# Patient Record
Sex: Female | Born: 1985
Health system: Southern US, Community
[De-identification: ages and names within clinical notes are randomized; demographics above are authoritative.]

## PROBLEM LIST (undated history)

## (undated) DIAGNOSIS — O24419 Gestational diabetes mellitus in pregnancy, unspecified control: Secondary | ICD-10-CM

## (undated) DIAGNOSIS — F32A Depression, unspecified: Secondary | ICD-10-CM

## (undated) DIAGNOSIS — IMO0001 Reserved for inherently not codable concepts without codable children: Secondary | ICD-10-CM

## (undated) DIAGNOSIS — F329 Major depressive disorder, single episode, unspecified: Secondary | ICD-10-CM

## (undated) DIAGNOSIS — R030 Elevated blood-pressure reading, without diagnosis of hypertension: Secondary | ICD-10-CM

## (undated) DIAGNOSIS — I10 Essential (primary) hypertension: Secondary | ICD-10-CM

## (undated) HISTORY — DX: Depression, unspecified: F32.A

## (undated) HISTORY — DX: Elevated blood-pressure reading, without diagnosis of hypertension: R03.0

## (undated) HISTORY — DX: Essential (primary) hypertension: I10

## (undated) HISTORY — DX: Gestational diabetes mellitus in pregnancy, unspecified control: O24.419

## (undated) HISTORY — DX: Major depressive disorder, single episode, unspecified: F32.9

## (undated) HISTORY — PX: NO PAST SURGERIES: SHX2092

## (undated) HISTORY — DX: Reserved for inherently not codable concepts without codable children: IMO0001

---

## 2016-05-16 ENCOUNTER — Encounter: Payer: Self-pay | Admitting: Primary Care

## 2016-05-16 ENCOUNTER — Ambulatory Visit (INDEPENDENT_AMBULATORY_CARE_PROVIDER_SITE_OTHER): Payer: BLUE CROSS/BLUE SHIELD | Admitting: Primary Care

## 2016-05-16 VITALS — BP 142/90 | HR 82 | Temp 98.3°F | Ht 62.0 in | Wt 186.4 lb

## 2016-05-16 DIAGNOSIS — R030 Elevated blood-pressure reading, without diagnosis of hypertension: Secondary | ICD-10-CM | POA: Diagnosis not present

## 2016-05-16 DIAGNOSIS — I10 Essential (primary) hypertension: Secondary | ICD-10-CM | POA: Insufficient documentation

## 2016-05-16 DIAGNOSIS — IMO0001 Reserved for inherently not codable concepts without codable children: Secondary | ICD-10-CM

## 2016-05-16 DIAGNOSIS — M25569 Pain in unspecified knee: Secondary | ICD-10-CM

## 2016-05-16 DIAGNOSIS — M25561 Pain in right knee: Secondary | ICD-10-CM | POA: Diagnosis not present

## 2016-05-16 HISTORY — DX: Pain in unspecified knee: M25.569

## 2016-05-16 HISTORY — DX: Elevated blood-pressure reading, without diagnosis of hypertension: R03.0

## 2016-05-16 MED ORDER — PREDNISONE 10 MG PO TABS: ORAL_TABLET | ORAL | Status: DC

## 2016-05-16 NOTE — Patient Instructions (Signed)
Start prednisone for inflammation and pain to your knee. Take 3 tablets for 2 days, then 2 tablets for 2 days, then 1 tablet for 2 days.  Apply ice to your knee at least twice daily for 20 minutes at a time.  Continue tylenol as needed for pain.  You will be contacted regarding your referral to physical therapy.  Please let us know if you have not heard back within one week.   Please schedule a physical with me before the end of the year. You may also schedule a lab only appointment 3-4 days prior. We will discuss your lab results in detail during your physical.  It was a pleasure to meet you today! Please don't hesitate to call me with any questions. Welcome to [facility]!  Knee Pain Knee pain is a very common symptom and can have many causes. Knee pain often goes away when you follow your health care provider's instructions for relieving pain and discomfort at home. However, knee pain can develop into a condition that needs treatment. Some conditions may include:  Arthritis caused by wear and tear (osteoarthritis).  Arthritis caused by swelling and irritation (rheumatoid arthritis or gout).  A cyst or growth in your knee.  An infection in your knee joint.  An injury that will not heal.  Damage, swelling, or irritation of the tissues that support your knee (torn ligaments or tendinitis). If your knee pain continues, additional tests may be ordered to diagnose your condition. Tests may include X-rays or other imaging studies of your knee. You may also need to have fluid removed from your knee. Treatment for ongoing knee pain depends on the cause, but treatment may include:  Medicines to relieve pain or swelling.  Steroid injections in your knee.  Physical therapy.  Surgery. HOME CARE INSTRUCTIONS  Take medicines only as directed by your health care provider.  Rest your knee and keep it raised (elevated) while you are resting.  Do not do things that cause or worsen  pain.  Avoid high-impact activities or exercises, such as running, jumping rope, or doing jumping jacks.  Apply ice to the knee area:  Put ice in a plastic bag.  Place a towel between your skin and the bag.  Leave the ice on for 20 minutes, 2-3 times a day.  Ask your health care provider if you should wear an elastic knee support.  Keep a pillow under your knee when you sleep.  Lose weight if you are overweight. Extra weight can put pressure on your knee.  Do not use any tobacco products, including cigarettes, chewing tobacco, or electronic cigarettes. If you need help quitting, ask your health care provider. Smoking may slow the healing of any bone and joint problems that you may have. SEEK MEDICAL CARE IF:  Your knee pain continues, changes, or gets worse.  You have a fever along with knee pain.  Your knee buckles or locks up.  Your knee becomes more swollen. SEEK IMMEDIATE MEDICAL CARE IF:   Your knee joint feels hot to the touch.  You have chest pain or trouble breathing.   This information is not intended to replace advice given to you by your health care provider. Make sure you discuss any questions you have with your health care provider.   Document Released: 08/10/2007 Document Revised: 11/03/2014 Document Reviewed: 05/29/2014 Elsevier Interactive Patient Education [employer].

## 2016-05-16 NOTE — Assessment & Plan Note (Signed)
Slightly above goal in clinic today. Endorses history of prior elevations during pregnancies and also intermittently since. Elevation today could be due to acute knee pain. We'll have her monitor and report readings at or above 140/90.

## 2016-05-16 NOTE — Assessment & Plan Note (Signed)
Chronic for 1 year, worse over the last 2 months with swelling and increased discomfort. Exam with decreased range of motion due to discomfort and stiffness. Will send to physical therapy for further evaluation and treatment. Prescription for prednisone taper sent to pharmacy for acute inflammation as she is allergic to NSAIDs.

## 2016-05-16 NOTE — Progress Notes (Signed)
Pre visit review using our clinic review tool, if applicable. No additional management support is needed unless otherwise documented below in the visit note. 

## 2016-05-16 NOTE — Progress Notes (Signed)
   Subjective:    Patient ID: Brandy Mack, female    DOB: 11/13/1985, 30 y.o.   MRN: [id number]  HPI  Brandy Mack is a 30 year old female who presents today to establish care and discuss the problems mentioned below. Will obtain old records.  1) Knee Pain: Located to the right knee that has been present intermittently over the last 1 year. Over the past 2 months she's noticed popping, decreased flexion, and pain that will start 1 hour after getting out of bed and will progress throughout the day. She is on her feet all day during work hours. She feels as though it's unstable. She does not play sports, denies recent injury, denies family history of arthritis, numbness/tingling. She does experience pain to the right hip and wrist. She's taken tylenol with temporary improvement.   2) Elevated Blood Pressure Reading: Her blood pressure is above goal in the clinic today at 142/90. She endorses a history of elevated readings during her prior pregnancies. Family history of hypertension in her father. Denies chest pain, dizziness, changes in her vision.   Review of Systems  Constitutional: Negative for fever.  Respiratory: Negative for shortness of breath.   Cardiovascular: Negative for chest pain.  Musculoskeletal: Positive for arthralgias.       Right knee pain  Neurological: Negative for dizziness, numbness and headaches.       Past Medical History  Diagnosis Date  . Depression   . Elevated blood pressure      Social History   Social History  . Marital Status: Single    Spouse Name: N/A  . Number of Children: N/A  . Years of Education: N/A   Occupational History  . Not on file.   Social History Main Topics  . Smoking status: Current Every Day Smoker -- 0.25 packs/day    Types: Cigarettes  . Smokeless tobacco: Not on file  . Alcohol Use: 0.0 oz/week    0 Standard drinks or equivalent per week     Comment: social  . Drug Use: No  . Sexual Activity: Not on file   Other Topics  Concern  . Not on file   Social History Narrative   Single.   2 children.   Works at [employer].   Enjoys swimming, reading, spending time with her family.    No past surgical history on file.  Family History  Problem Relation Age of Onset  . Hypertension Father   . Stroke Father     Allergies  Allergen Reactions  . Naproxen Itching and Swelling    No current outpatient prescriptions on file prior to visit.   No current facility-administered medications on file prior to visit.    BP 142/90 mmHg  Pulse 82  Temp(Src) 98.3 F (36.8 C) (Oral)  Ht 5\' 2"  (1.575 m)  Wt 186 lb 6.4 oz (84.55 kg)  BMI 34.08 kg/m2  SpO2 97%  LMP 05/13/2016    Objective:   Physical Exam  Constitutional: She appears well-nourished.  Neck: Neck supple.  Cardiovascular: Normal rate and regular rhythm.   Pulmonary/Chest: Effort normal and breath sounds normal.  Musculoskeletal:       Right knee: She exhibits decreased range of motion and swelling. She exhibits no deformity and no erythema. No tenderness found.  Skin: Skin is warm and dry.          Assessment & Plan:

## 2016-08-01 ENCOUNTER — Other Ambulatory Visit: Payer: Self-pay | Admitting: Primary Care

## 2016-08-01 DIAGNOSIS — Z0000 Encounter for general adult medical examination without abnormal findings: Secondary | ICD-10-CM

## 2016-08-11 ENCOUNTER — Other Ambulatory Visit (INDEPENDENT_AMBULATORY_CARE_PROVIDER_SITE_OTHER): Payer: BLUE CROSS/BLUE SHIELD

## 2016-08-11 DIAGNOSIS — Z0000 Encounter for general adult medical examination without abnormal findings: Secondary | ICD-10-CM

## 2016-08-11 LAB — COMPREHENSIVE METABOLIC PANEL
ALT: 14 U/L (ref 0–35)
AST: 14 U/L (ref 0–37)
Albumin: 4.3 g/dL (ref 3.5–5.2)
Alkaline Phosphatase: 46 U/L (ref 39–117)
BUN: 14 mg/dL (ref 6–23)
CALCIUM: 9.2 mg/dL (ref 8.4–10.5)
CHLORIDE: 106 meq/L (ref 96–112)
CO2: 23 meq/L (ref 19–32)
CREATININE: 0.86 mg/dL (ref 0.40–1.20)
GFR: 81.58 mL/min (ref >60.00)
GLUCOSE: 98 mg/dL (ref 70–99)
Potassium: 4.5 mEq/L (ref 3.5–5.1)
SODIUM: 137 meq/L (ref 135–145)
Total Bilirubin: 0.4 mg/dL (ref 0.2–1.2)
Total Protein: 7 g/dL (ref 6.0–8.3)

## 2016-08-11 LAB — LIPID PANEL
CHOL/HDL RATIO: 3
Cholesterol: 169 mg/dL (ref 0–200)
HDL: 51 mg/dL (ref >39.00)
LDL CALC: 98 mg/dL (ref 0–99)
NonHDL: 118.24
TRIGLYCERIDES: 100 mg/dL (ref 0.0–149.0)
VLDL: 20 mg/dL (ref 0.0–40.0)

## 2016-08-18 ENCOUNTER — Other Ambulatory Visit (HOSPITAL_COMMUNITY)
Admission: RE | Admit: 2016-08-18 | Discharge: 2016-08-18 | Disposition: A | Discharge status: Home / Self Care | Payer: BLUE CROSS/BLUE SHIELD | Source: Ambulatory Visit | Attending: Primary Care | Admitting: Primary Care

## 2016-08-18 ENCOUNTER — Ambulatory Visit (INDEPENDENT_AMBULATORY_CARE_PROVIDER_SITE_OTHER): Payer: BLUE CROSS/BLUE SHIELD | Admitting: Primary Care

## 2016-08-18 ENCOUNTER — Encounter: Payer: Self-pay | Admitting: Primary Care

## 2016-08-18 VITALS — BP 136/88 | HR 85 | Temp 99.1°F | Ht 62.0 in | Wt 189.8 lb

## 2016-08-18 DIAGNOSIS — R030 Elevated blood-pressure reading, without diagnosis of hypertension: Secondary | ICD-10-CM | POA: Diagnosis not present

## 2016-08-18 DIAGNOSIS — Z124 Encounter for screening for malignant neoplasm of cervix: Secondary | ICD-10-CM | POA: Diagnosis not present

## 2016-08-18 DIAGNOSIS — Z0000 Encounter for general adult medical examination without abnormal findings: Secondary | ICD-10-CM | POA: Diagnosis not present

## 2016-08-18 DIAGNOSIS — Z1151 Encounter for screening for human papillomavirus (HPV): Secondary | ICD-10-CM | POA: Insufficient documentation

## 2016-08-18 DIAGNOSIS — Z23 Encounter for immunization: Secondary | ICD-10-CM

## 2016-08-18 DIAGNOSIS — Z01419 Encounter for gynecological examination (general) (routine) without abnormal findings: Secondary | ICD-10-CM | POA: Insufficient documentation

## 2016-08-18 NOTE — Assessment & Plan Note (Signed)
Td due, provided today. Influenza vaccination provided today. Pap due, completed and is pending. Discussed self breast exams. Breast exam unremarkable today. Discussed the importance of a healthy diet and regular exercise in order for weight loss, and to reduce the risk of other medical diseases. Exam unremarkable.  Labs unremarkable. Follow up in 1 year for repeat physical.

## 2016-08-18 NOTE — Addendum Note (Signed)
Addended by: Jacqualin Combes on: 08/18/2016 10:05 AM   Modules accepted: Orders

## 2016-08-18 NOTE — Progress Notes (Signed)
Pre visit review using our clinic review tool, if applicable. No additional management support is needed unless otherwise documented below in the visit note. 

## 2016-08-18 NOTE — Assessment & Plan Note (Signed)
Above goal in the office today, improved on recheck. Will have her continue to work on lifestyle changes. She will monitor BP and report readings at or above 140/90.

## 2016-08-18 NOTE — Progress Notes (Signed)
Subjective:    Patient ID: Brandy Mack, female    DOB: April 15, 1986, 30 y.o.   MRN: [id number]  HPI  Brandy Mack is a 30 year old female who presents today for complete physical.  Immunizations: -Tetanus: Unsure, believes it's been over 10 years -Influenza: Due today.   Diet: Endorses a healthy diet Breakfast: Grits, egg Lunch: Salad with protein Dinner: Meat, vegetables, starch Snacks: Crackers Desserts: None Beverages: Water, coffee  Exercise: She exercises on her elliptical 15 minutes daily Eye exam: Completed 1 year ago Dental exam: Completes annually Pap Smear: Completed 3 years ago.   Review of Systems  Constitutional: Negative for unexpected weight change.  HENT: Negative for rhinorrhea.   Respiratory: Negative for cough and shortness of breath.   Cardiovascular: Negative for chest pain.  Gastrointestinal: Negative for constipation and diarrhea.  Genitourinary: Negative for difficulty urinating and menstrual problem.  Musculoskeletal: Negative for arthralgias and myalgias.  Skin: Negative for rash.  Allergic/Immunologic: Negative for environmental allergies.  Neurological: Negative for dizziness, numbness and headaches.  Psychiatric/Behavioral:       Denies concerns for anxiety or depression       Past Medical History:  Diagnosis Date  . Depression   . Elevated blood pressure      Social History   Social History  . Marital status: Single    Spouse name: N/A  . Number of children: N/A  . Years of education: N/A   Occupational History  . Not on file.   Social History Main Topics  . Smoking status: Current Every Day Smoker    Packs/day: 0.25    Types: Cigarettes  . Smokeless tobacco: Not on file  . Alcohol use 0.0 oz/week     Comment: social  . Drug use: No  . Sexual activity: Not on file   Other Topics Concern  . Not on file   Social History Narrative   Single.   2 children.   Works at [employer].   Enjoys swimming, reading, spending  time with her family.    No past surgical history on file.  Family History  Problem Relation Age of Onset  . Hypertension Father   . Stroke Father     Allergies  Allergen Reactions  . Naproxen Itching and Swelling    No current outpatient prescriptions on file prior to visit.   No current facility-administered medications on file prior to visit.     BP 140/90   Pulse 85   Temp 99.1 F (37.3 C) (Oral)   Ht 5\' 2"  (1.575 m)   Wt 189 lb 12.8 oz (86.1 kg)   LMP 07/25/2016   SpO2 98%   BMI 34.71 kg/m    Objective:   Physical Exam  Constitutional: She is oriented to person, place, and time. She appears well-nourished.  HENT:  Right Ear: Tympanic membrane and ear canal normal.  Left Ear: Tympanic membrane and ear canal normal.  Nose: Nose normal.  Mouth/Throat: Oropharynx is clear and moist.  Eyes: Conjunctivae and EOM are normal. Pupils are equal, round, and reactive to light.  Neck: Neck supple. No thyromegaly present.  Cardiovascular: Normal rate and regular rhythm.   No murmur heard. Pulmonary/Chest: Effort normal and breath sounds normal. She has no rales. Right breast exhibits no mass, no skin change and no tenderness. Left breast exhibits no mass, no skin change and no tenderness.  Abdominal: Soft. Bowel sounds are normal. There is no tenderness.  Genitourinary: There is no lesion on the right labia.  There is no lesion on the left labia. Cervix exhibits no motion tenderness and no discharge. Right adnexum displays no tenderness. Left adnexum displays no tenderness. No vaginal discharge found.  Genitourinary Comments: Small growth to right labia minora, non tender, appears to be benign cyst.   Musculoskeletal: Normal range of motion.  Lymphadenopathy:    She has no cervical adenopathy.  Neurological: She is alert and oriented to person, place, and time. She has normal reflexes. No cranial nerve deficit.  Skin: Skin is warm and dry. No rash noted.  Psychiatric: She  has a normal mood and affect.          Assessment & Plan:

## 2016-08-18 NOTE — Patient Instructions (Addendum)
It's importance to continue working on your diet by reducing consumption of starchy food, processed snack foods. Increase consumption of fresh vegetables and fruits, whole grains, water.  Ensure you are drinking 64 ounces of water daily.  Continue exercising. You should be getting 150 minutes of moderate intensity exercise weekly.  Monitor your blood pressure as discussed. Please notify me of readings at or above 140/90 on a consistent basis.  You were provided with influenza and tetanus vaccinations today.  We will notify you of your pap results once received.  Follow up in 1 year for repeat physical or sooner if needed.  It was a pleasure to see you today!

## 2016-08-19 LAB — CYTOLOGY - PAP
Adequacy: ABSENT
DIAGNOSIS: NEGATIVE
HPV: NOT DETECTED

## 2016-08-20 ENCOUNTER — Encounter: Payer: Self-pay | Admitting: *Deleted

## 2016-11-18 ENCOUNTER — Encounter: Payer: Self-pay | Admitting: Primary Care

## 2016-11-18 ENCOUNTER — Ambulatory Visit (INDEPENDENT_AMBULATORY_CARE_PROVIDER_SITE_OTHER): Payer: BLUE CROSS/BLUE SHIELD | Admitting: Primary Care

## 2016-11-18 VITALS — BP 124/84 | HR 91 | Temp 98.6°F | Ht 62.0 in | Wt 186.8 lb

## 2016-11-18 DIAGNOSIS — L298 Other pruritus: Secondary | ICD-10-CM

## 2016-11-18 DIAGNOSIS — Z113 Encounter for screening for infections with a predominantly sexual mode of transmission: Secondary | ICD-10-CM | POA: Diagnosis not present

## 2016-11-18 DIAGNOSIS — N898 Other specified noninflammatory disorders of vagina: Secondary | ICD-10-CM

## 2016-11-18 NOTE — Progress Notes (Signed)
Pre visit review using our clinic review tool, if applicable. No additional management support is needed unless otherwise documented below in the visit note. 

## 2016-11-18 NOTE — Progress Notes (Signed)
   Subjective:    Patient ID: Brandy Mack, female    DOB: 05/02/1986, 31 y.o.   MRN: [id number]  HPI  Ms. Scurry is a 31 year old female who presents today with a chief complaint of vaginal itching. Her symptoms began with vaginal soreness and swelling Wednesday last week. Her symptoms progressed over the weekend so she purchased Monistat and has had complete resolve of symptoms with reduction in cyst. She denies vaginal discharge, vaginal itching. She has recently switched to a new soap last week. LMP on 10/31/16. She does have a history of BV. She recently found that her husband had intercourse with another woman and would like STD testing.  Review of Systems  Constitutional: Negative for fever.  Genitourinary: Negative for dysuria, frequency, pelvic pain, vaginal bleeding and vaginal discharge.       Past Medical History:  Diagnosis Date  . Depression   . Elevated blood pressure      Social History   Social History  . Marital status: Single    Spouse name: N/A  . Number of children: N/A  . Years of education: N/A   Occupational History  . Not on file.   Social History Main Topics  . Smoking status: Current Every Day Smoker    Packs/day: 0.25    Types: Cigarettes  . Smokeless tobacco: Never Used  . Alcohol use 0.0 oz/week     Comment: social  . Drug use: No  . Sexual activity: Not on file   Other Topics Concern  . Not on file   Social History Narrative   Single.   2 children.   Works at [employer].   Enjoys swimming, reading, spending time with her family.    No past surgical history on file.  Family History  Problem Relation Age of Onset  . Hypertension Father   . Stroke Father     Allergies  Allergen Reactions  . Naproxen Itching and Swelling    No current outpatient prescriptions on file prior to visit.   No current facility-administered medications on file prior to visit.     BP 124/84   Pulse 91   Temp 98.6 F (37 C) (Oral)   Ht 5\' 2"   (1.575 m)   Wt 186 lb 12.8 oz (84.7 kg)   LMP 10/31/2016   SpO2 98%   BMI 34.17 kg/m    Objective:   Physical Exam  Constitutional: She appears well-nourished.  Neck: Neck supple.  Cardiovascular: Normal rate and regular rhythm.   Pulmonary/Chest: Effort normal and breath sounds normal.  Genitourinary: There is no tenderness on the right labia. There is no tenderness on the left labia. Cervix exhibits discharge. Cervix exhibits no motion tenderness. Vaginal discharge found.  Genitourinary Comments: Small right skeene gland swelling labia. Does not appear acutely infected. Moderate amount of whitish/yellow discharge from cervix.  Skin: Skin is warm and dry.          Assessment & Plan:  Vaginal Itching:  Also with discharge. Symptoms have resolved since use of Monistat. Pelvic exam with moderate amount of discharge noted.  Wet prep and Gonorrhea/Chlamydia swabs collected and pending. RPR, HSV, and HIV pending. Will await results as symptoms have resolved.  Sheral Flow, NP

## 2016-11-18 NOTE — Patient Instructions (Signed)
Complete lab work prior to leaving today. I will notify you of your results once received.   It was a pleasure to see you today!  

## 2016-11-19 LAB — GC/CHLAMYDIA PROBE AMP
CT PROBE, AMP APTIMA: NOT DETECTED
GC Probe RNA: NOT DETECTED

## 2016-11-19 LAB — WET PREP BY MOLECULAR PROBE
Candida species: NEGATIVE
GARDNERELLA VAGINALIS: NEGATIVE
Trichomonas vaginosis: NEGATIVE

## 2016-11-19 LAB — RPR

## 2016-11-19 LAB — HIV ANTIBODY (ROUTINE TESTING W REFLEX): HIV 1&2 Ab, 4th Generation: NONREACTIVE

## 2016-11-21 ENCOUNTER — Encounter: Payer: Self-pay | Admitting: Primary Care

## 2016-11-22 LAB — HSV TYPE I/II IGG, IGMW/ REFLEX
HSV 1 Glycoprotein G Ab, IgG: 18.9 Index — ABNORMAL HIGH (ref <0.90)
HSV 1 IGM SCREEN: NEGATIVE
HSV 2 Glycoprotein G Ab, IgG: 5.94 Index — ABNORMAL HIGH (ref <0.90)
HSV 2 IGM SCREEN: NEGATIVE

## 2017-04-08 ENCOUNTER — Encounter: Payer: Self-pay | Admitting: Primary Care

## 2017-04-08 ENCOUNTER — Ambulatory Visit (INDEPENDENT_AMBULATORY_CARE_PROVIDER_SITE_OTHER): Payer: BLUE CROSS/BLUE SHIELD | Admitting: Primary Care

## 2017-04-08 VITALS — BP 136/80 | HR 89 | Temp 98.4°F | Ht 62.0 in | Wt 184.0 lb

## 2017-04-08 DIAGNOSIS — R229 Localized swelling, mass and lump, unspecified: Secondary | ICD-10-CM | POA: Diagnosis not present

## 2017-04-08 NOTE — Patient Instructions (Signed)
Try the liquid wart remover in addition to our freezing today.  Please notify me if no improvement in 1-2 weeks.   It was a pleasure to see you today!

## 2017-04-08 NOTE — Progress Notes (Signed)
   Subjective:    Patient ID: Brandy Mack, female    DOB: [birth year], 31 y.o.   MRN: [id number]  HPI  Brandy Mack is a 31 year old female who presents today with a chief complaint of thumb mass. The mass is located to the edge of the left upper border of her thumb tip. She first noticed this two months ago. She's tried "freeze off" wart remover, fungal cream, having her nail salon cut back her nail without improvement. She's noticed tenderness and swelling. She denies erythema, fevers, injury or trauma.  Review of Systems  Constitutional: Negative for fever.  Skin: Negative for color change.       Skin lesion/mass       Past Medical History:  Diagnosis Date  . Depression   . Elevated blood pressure      Social History   Social History  . Marital status: Single    Spouse name: N/A  . Number of children: N/A  . Years of education: N/A   Occupational History  . Not on file.   Social History Main Topics  . Smoking status: Current Every Day Smoker    Packs/day: 0.25    Types: Cigarettes  . Smokeless tobacco: Never Used  . Alcohol use 0.0 oz/week     Comment: social  . Drug use: No  . Sexual activity: Not on file   Other Topics Concern  . Not on file   Social History Narrative   Single.   2 children.   Works at [employer].   Enjoys swimming, reading, spending time with her family.    No past surgical history on file.  Family History  Problem Relation Age of Onset  . Hypertension Father   . Stroke Father     Allergies  Allergen Reactions  . Naproxen Itching and Swelling    No current outpatient prescriptions on file prior to visit.   No current facility-administered medications on file prior to visit.     BP 136/80   Pulse 89   Temp 98.4 F (36.9 C) (Oral)   Ht 5\' 2"  (1.575 m)   Wt 184 lb (83.5 kg)   LMP 03/31/2017   SpO2 96%   BMI 33.65 kg/m    Objective:   Physical Exam  Skin: Skin is warm and dry.  2 mm circular raised flesh-colored  lesion to tip of medial thumb near nail border. Small black speck noted centrally. Mildly tender.          Assessment & Plan:  Wart:  Mass to left thumb representative of a wart. No suspicious lesion or cellulitis. Given failure of over-the-counter treatments, liquid nitrogen applied today. Discussed for her to obtain liquid wart remover over-the-counter. She will update in 1-2 weeks if no improvement. At that point consider dermatology referral.

## 2018-03-15 ENCOUNTER — Encounter: Payer: Self-pay | Admitting: Primary Care

## 2018-03-15 ENCOUNTER — Ambulatory Visit (INDEPENDENT_AMBULATORY_CARE_PROVIDER_SITE_OTHER): Payer: BLUE CROSS/BLUE SHIELD | Admitting: Primary Care

## 2018-03-15 VITALS — BP 126/86 | HR 84 | Temp 98.0°F | Ht 62.0 in | Wt 191.5 lb

## 2018-03-15 DIAGNOSIS — R21 Rash and other nonspecific skin eruption: Secondary | ICD-10-CM

## 2018-03-15 DIAGNOSIS — N644 Mastodynia: Secondary | ICD-10-CM

## 2018-03-15 MED ORDER — TRIAMCINOLONE ACETONIDE 0.5 % EX OINT: 1.0000 "application " | TOPICAL_OINTMENT | Freq: Two times a day (BID) | CUTANEOUS | 0 refills | Status: DC

## 2018-03-15 NOTE — Progress Notes (Signed)
Subjective:    Patient ID: Brandy Mack, female    DOB: 29-Nov-1985, 32 y.o.   MRN: [id number]  HPI  Ms. Dicocco is a 32 year old female who presents today with a chief complaint of rash.  Her rash is located to the left breast to the aerola for which started as a small "spot". She first noticed this October 2018 as a dry/itchy spot, thought this was due to the dry weather. She then noticed that the itching progressed and started to notice another spot to the same aerola breast several months ago. She's also noticed some drainage/puss over the last several months, thinks it was infected. She's been noticing left lateral breast and axillary pain. She's been using antibacterial soap OTC with some improvement, also some OTC anti-itch cream without improvement.   She is adopted and unsure of her family history.   Review of Systems  Constitutional: Negative for fever.  Genitourinary:       Left breast pain  Skin: Positive for color change and rash.       Past Medical History:  Diagnosis Date  . Depression   . Elevated blood pressure      Social History   Socioeconomic History  . Marital status: Single    Spouse name: Not on file  . Number of children: Not on file  . Years of education: Not on file  . Highest education level: Not on file  Occupational History  . Not on file  Social Needs  . Financial resource strain: Not on file  . Food insecurity:    Worry: Not on file    Inability: Not on file  . Transportation needs:    Medical: Not on file    Non-medical: Not on file  Tobacco Use  . Smoking status: Current Every Day Smoker    Packs/day: 0.25    Types: Cigarettes  . Smokeless tobacco: Never Used  Substance and Sexual Activity  . Alcohol use: Yes    Alcohol/week: 0.0 oz    Comment: social  . Drug use: No  . Sexual activity: Not on file  Lifestyle  . Physical activity:    Days per week: Not on file    Minutes per session: Not on file  . Stress: Not on file    Relationships  . Social connections:    Talks on phone: Not on file    Gets together: Not on file    Attends religious service: Not on file    Active member of club or organization: Not on file    Attends meetings of clubs or organizations: Not on file    Relationship status: Not on file  . Intimate partner violence:    Fear of current or ex partner: Not on file    Emotionally abused: Not on file    Physically abused: Not on file    Forced sexual activity: Not on file  Other Topics Concern  . Not on file  Social History Narrative   Single.   2 children.   Works at [employer].   Enjoys swimming, reading, spending time with her family.     Family History  Problem Relation Age of Onset  . Hypertension Father   . Stroke Father     Allergies  Allergen Reactions  . Naproxen Itching and Swelling    No current outpatient medications on file prior to visit.   No current facility-administered medications on file prior to visit.     BP 126/86  Pulse 84   Temp 98 F (36.7 C) (Oral)   Ht 5\' 2"  (1.575 m)   Wt 191 lb 8 oz (86.9 kg)   LMP 02/20/2018   SpO2 98%   BMI 35.03 kg/m    Objective:   Physical Exam  Constitutional: She appears well-nourished.  Pulmonary/Chest: She exhibits no mass and no tenderness. Left breast exhibits skin change and tenderness.  Mild erythema with scaling to both sites as noted. Tenderness to left breast at 6 o'clock position.    Skin: Skin is warm and dry.          Assessment & Plan:  Breast Rash and Pain:  Since October 2018. Exam today with eczema type rash that appears irritated. Breast exam with tenderness to 6 o'clock position. Rx for triamcinolone ointment sent to pharmacy. Diagnostic mammogram and bilateral US ordered and pending. She will update If rash does not improve with treatment.  Pleas Koch, NP

## 2018-03-15 NOTE — Patient Instructions (Signed)
Apply the triamcinolone ointment twice daily for one week. Please update me if no improvement.  You will be contacted regarding your mammogram.  Please let us know if you have not been contacted within one week.   It was a pleasure to see you today!

## 2018-03-25 ENCOUNTER — Inpatient Hospital Stay: Admission: RE | Admit: 2018-03-25 | Payer: BLUE CROSS/BLUE SHIELD | Source: Ambulatory Visit

## 2018-03-25 ENCOUNTER — Other Ambulatory Visit: Payer: BLUE CROSS/BLUE SHIELD

## 2018-04-28 ENCOUNTER — Ambulatory Visit
Admission: RE | Admit: 2018-04-28 | Discharge: 2018-04-28 | Disposition: A | Discharge status: Home / Self Care | Payer: BLUE CROSS/BLUE SHIELD | Source: Ambulatory Visit | Attending: Primary Care | Admitting: Primary Care

## 2018-04-28 DIAGNOSIS — R21 Rash and other nonspecific skin eruption: Secondary | ICD-10-CM | POA: Insufficient documentation

## 2018-04-28 DIAGNOSIS — N644 Mastodynia: Secondary | ICD-10-CM | POA: Diagnosis not present

## 2019-10-14 ENCOUNTER — Other Ambulatory Visit: Payer: BLUE CROSS/BLUE SHIELD

## 2019-10-17 ENCOUNTER — Ambulatory Visit: Payer: BC Managed Care – PPO | Attending: Internal Medicine

## 2019-10-17 DIAGNOSIS — Z20822 Contact with and (suspected) exposure to covid-19: Secondary | ICD-10-CM

## 2019-10-18 ENCOUNTER — Other Ambulatory Visit: Payer: BC Managed Care – PPO

## 2019-10-18 ENCOUNTER — Other Ambulatory Visit: Payer: Self-pay

## 2019-10-18 DIAGNOSIS — R21 Rash and other nonspecific skin eruption: Secondary | ICD-10-CM

## 2019-10-18 LAB — NOVEL CORONAVIRUS, NAA: SARS-CoV-2, NAA: NOT DETECTED

## 2019-11-04 ENCOUNTER — Encounter: Payer: BC Managed Care – PPO | Admitting: Primary Care

## 2020-02-07 ENCOUNTER — Ambulatory Visit (INDEPENDENT_AMBULATORY_CARE_PROVIDER_SITE_OTHER): Payer: BC Managed Care – PPO | Admitting: Primary Care

## 2020-02-07 ENCOUNTER — Encounter: Payer: Self-pay | Admitting: Primary Care

## 2020-02-07 ENCOUNTER — Other Ambulatory Visit: Payer: Self-pay | Admitting: Primary Care

## 2020-02-07 ENCOUNTER — Other Ambulatory Visit: Payer: Self-pay

## 2020-02-07 VITALS — BP 136/86 | HR 90 | Temp 96.8°F | Ht 62.0 in | Wt 216.5 lb

## 2020-02-07 DIAGNOSIS — Z3201 Encounter for pregnancy test, result positive: Secondary | ICD-10-CM | POA: Diagnosis not present

## 2020-02-07 HISTORY — DX: Encounter for pregnancy test, result positive: Z32.01

## 2020-02-07 LAB — HCG, QUANTITATIVE, PREGNANCY: Quantitative HCG: 48453 m[IU]/mL

## 2020-02-07 NOTE — Assessment & Plan Note (Signed)
LMP on 12/25/19, symptoms of pregnancy. HCG quant pending. Discussed prenatal care, handout provided.  Will refer to OB/GYN once lab returns.

## 2020-02-07 NOTE — Patient Instructions (Addendum)
Stop by the lab prior to leaving today. I will notify you of your results once received.   Continue to take prenatal vitamins. Avoid OTC medications.  You will be contacted regarding your referral to OB/GYN.  Please let us know if you have not been contacted within two weeks.   It was a pleasure to see you today!   Prenatal Care Prenatal care is health care during pregnancy. It helps you and your unborn baby (fetus) stay as healthy as possible. Prenatal care may be provided by a midwife, a family practice health care provider, or a childbirth and pregnancy specialist (obstetrician). How does this affect me? During pregnancy, you will be closely monitored for any new conditions that might develop. To lower your risk of pregnancy complications, you and your health care provider will talk about any underlying conditions you have. How does this affect my baby? Early and consistent prenatal care increases the chance that your baby will be healthy during pregnancy. Prenatal care lowers the risk that your baby will be:  Born early (prematurely).  Smaller than expected at birth (small for gestational age). What can I expect at the first prenatal care visit? Your first prenatal care visit will likely be the longest. You should schedule your first prenatal care visit as soon as you know that you are pregnant. Your first visit is a good time to talk about any questions or concerns you have about pregnancy. At your visit, you and your health care provider will talk about:  Your medical history, including: ? Any past pregnancies. ? Your family's medical history. ? The baby's father's medical history. ? Any long-term (chronic) health conditions you have and how you manage them. ? Any surgeries or procedures you have had. ? Any current over-the-counter or prescription medicines, herbs, or supplements you are taking.  Other factors that could pose a risk to your baby, including:  Your home setting  and your stress levels, including: ? Exposure to abuse or violence. ? Household financial strain. ? Mental health conditions you have.  Your daily health habits, including diet and exercise. Your health care provider will also:  Measure your weight, height, and blood pressure.  Do a physical exam, including a pelvic and breast exam.  Perform blood tests and urine tests to check for: ? Urinary tract infection. ? Sexually transmitted infections (STIs). ? Low iron levels in your blood (anemia). ? Blood type and certain proteins on red blood cells (Rh antibodies). ? Infections and immunity to viruses, such as hepatitis B and rubella. ? HIV (human immunodeficiency virus).  Do an ultrasound to confirm your baby's growth and development and to help predict your estimated due date (EDD). This ultrasound is done with a probe that is inserted into the vagina (transvaginal ultrasound).  Discuss your options for genetic screening.  Give you information about how to keep yourself and your baby healthy, including: ? Nutrition and taking vitamins. ? Physical activity. ? How to manage pregnancy symptoms such as nausea and vomiting (morning sickness). ? Infections and substances that may be harmful to your baby and how to avoid them. ? Food safety. ? Dental care. ? Working. ? Travel. ? Warning signs to watch for and when to call your health care provider. How often will I have prenatal care visits? After your first prenatal care visit, you will have regular visits throughout your pregnancy. The visit schedule is often as follows:  Up to week 28 of pregnancy: once every 4 weeks.  28-36  weeks: once every 2 weeks.  After 36 weeks: every week until delivery. Some women may have visits more or less often depending on any underlying health conditions and the health of the baby. Keep all follow-up and prenatal care visits as told by your health care provider. This is important. What happens  during routine prenatal care visits? Your health care provider will:  Measure your weight and blood pressure.  Check for fetal heart sounds.  Measure the height of your uterus in your abdomen (fundal height). This may be measured starting around week 20 of pregnancy.  Check the position of your baby inside your uterus.  Ask questions about your diet, sleeping patterns, and whether you can feel the baby move.  Review warning signs to watch for and signs of labor.  Ask about any pregnancy symptoms you are having and how you are dealing with them. Symptoms may include: ? Headaches. ? Nausea and vomiting. ? Vaginal discharge. ? Swelling. ? Fatigue. ? Constipation. ? Any discomfort, including back or pelvic pain. Make a list of questions to ask your health care provider at your routine visits. What tests might I have during prenatal care visits? You may have blood, urine, and imaging tests throughout your pregnancy, such as:  Urine tests to check for glucose, protein, or signs of infection.  Glucose tests to check for a form of diabetes that can develop during pregnancy (gestational diabetes mellitus). This is usually done around week 24 of pregnancy.  An ultrasound to check your baby's growth and development and to check for birth defects. This is usually done around week 20 of pregnancy.  A test to check for group B strep (GBS) infection. This is usually done around week 36 of pregnancy.  Genetic testing. This may include blood or imaging tests, such as an ultrasound. Some genetic tests are done during the first trimester and some are done during the second trimester. What else can I expect during prenatal care visits? Your health care provider may recommend getting certain vaccines during pregnancy. These may include:  A yearly flu shot (annual influenza vaccine). This is especially important if you will be pregnant during flu season.  Tdap (tetanus, diphtheria, pertussis)  vaccine. Getting this vaccine during pregnancy can protect your baby from whooping cough (pertussis) after birth. This vaccine may be recommended between weeks 27 and 36 of pregnancy. Later in your pregnancy, your health care provider may give you information about:  Childbirth and breastfeeding classes.  Choosing a health care provider for your baby.  Umbilical cord banking.  Breastfeeding.  Birth control after your baby is born.  The hospital labor and delivery unit and how to tour it.  Registering at the hospital before you go into labor. Where to find more information  Office on Women's Health: LegalWarrants.gl  American Pregnancy Association: americanpregnancy.org  March of Dimes: marchofdimes.org Summary  Prenatal care helps you and your baby stay as healthy as possible during pregnancy.  Your first prenatal care visit will most likely be the longest.  You will have visits and tests throughout your pregnancy to monitor your health and your baby's health.  Bring a list of questions to your visits to ask your health care provider.  Make sure to keep all follow-up and prenatal care visits with your health care provider. This information is not intended to replace advice given to you by your health care provider. Make sure you discuss any questions you have with your health care provider. Document Revised: 02/02/2019  Document Reviewed: 10/12/2017 Elsevier Patient Education  [employer].

## 2020-02-07 NOTE — Progress Notes (Signed)
Subjective:    Patient ID: Brandy Mack, female    DOB: 05-Mar-1986, 34 y.o.   MRN: [id number]  HPI  This visit occurred during the SARS-CoV-2 public health emergency.  Safety protocols were in place, including screening questions prior to the visit, additional usage of staff PPE, and extensive cleaning of exam room while observing appropriate contact time as indicated for disinfecting solutions.   Brandy Mack is a 34 year old female who presents today to discuss potential pregnancy.  LMP was December 25, 2019. She has taken one pregnancy test on April 1st which was positive. She's having breast tenderness, exertional shortness of breath, fatigue, urinary frequency, feeling hungry. She denies vaginal bleeding.   She does not have OB/GYN. She started a prenatal vitamin two weeks ago. She quit smoking three days ago. She has two living children.  BP Readings from Last 3 Encounters:  02/07/20 136/86  03/15/18 126/86  04/08/17 136/80     Review of Systems  Constitutional: Positive for fatigue.  Respiratory: Positive for shortness of breath.   Genitourinary: Positive for frequency. Negative for vaginal bleeding.       Breast tenderness       Past Medical History:  Diagnosis Date  . Depression   . Elevated blood pressure      Social History   Socioeconomic History  . Marital status: Single    Spouse name: Not on file  . Number of children: Not on file  . Years of education: Not on file  . Highest education level: Not on file  Occupational History  . Not on file  Tobacco Use  . Smoking status: Current Every Day Smoker    Packs/day: 0.25    Types: Cigarettes  . Smokeless tobacco: Never Used  Substance and Sexual Activity  . Alcohol use: Yes    Alcohol/week: 0.0 standard drinks    Comment: social  . Drug use: No  . Sexual activity: Not on file  Other Topics Concern  . Not on file  Social History Narrative   Single.   2 children.   Works at [employer].   Enjoys  swimming, reading, spending time with her family.   Social Determinants of Health   Financial Resource Strain:   . Difficulty of Paying Living Expenses:   Food Insecurity:   . Worried About Charity fundraiser in the Last Year:   . Arboriculturist in the Last Year:   Transportation Needs:   . Film/video editor (Medical):   Marland Kitchen Lack of Transportation (Non-Medical):   Physical Activity:   . Days of Exercise per Week:   . Minutes of Exercise per Session:   Stress:   . Feeling of Stress :   Social Connections:   . Frequency of Communication with Friends and Family:   . Frequency of Social Gatherings with Friends and Family:   . Attends Religious Services:   . Active Member of Clubs or Organizations:   . Attends Archivist Meetings:   Marland Kitchen Marital Status:   Intimate Partner Violence:   . Fear of Current or Ex-Partner:   . Emotionally Abused:   Marland Kitchen Physically Abused:   . Sexually Abused:     No past surgical history on file.  Family History  Adopted: Yes  Problem Relation Age of Onset  . Hypertension Father   . Stroke Father     Allergies  Allergen Reactions  . Naproxen Itching and Swelling    Current Outpatient Medications on  File Prior to Visit  Medication Sig Dispense Refill  . triamcinolone ointment (KENALOG) 0.5 % Apply 1 application topically 2 (two) times daily. (Patient not taking: Reported on 02/07/2020) 30 g 0   No current facility-administered medications on file prior to visit.    BP 136/86   Pulse 90   Temp (!) 96.8 F (36 C) (Temporal)   Ht 5\' 2"  (1.575 m)   Wt 216 lb 8 oz (98.2 kg)   LMP 12/25/2019   SpO2 98%   BMI 39.60 kg/m    Objective:   Physical Exam  Constitutional: She appears well-nourished.  Cardiovascular: Normal rate and regular rhythm.  Respiratory: Effort normal and breath sounds normal.  Musculoskeletal:     Cervical back: Neck supple.  Skin: Skin is warm and dry.           Assessment & Plan:

## 2020-02-14 ENCOUNTER — Telehealth: Payer: Self-pay | Admitting: Radiology

## 2020-02-14 NOTE — Telephone Encounter (Signed)
Left message for patient to call [facility], referral for pos blood pregnancy test.

## 2020-03-07 ENCOUNTER — Encounter: Payer: Self-pay | Admitting: Advanced Practice Midwife

## 2020-03-07 ENCOUNTER — Other Ambulatory Visit (HOSPITAL_COMMUNITY)
Admission: RE | Admit: 2020-03-07 | Discharge: 2020-03-07 | Disposition: A | Discharge status: Home / Self Care | Payer: BC Managed Care – PPO | Source: Ambulatory Visit | Attending: Advanced Practice Midwife | Admitting: Advanced Practice Midwife

## 2020-03-07 ENCOUNTER — Other Ambulatory Visit: Payer: Self-pay

## 2020-03-07 ENCOUNTER — Ambulatory Visit (INDEPENDENT_AMBULATORY_CARE_PROVIDER_SITE_OTHER): Payer: BC Managed Care – PPO | Admitting: Advanced Practice Midwife

## 2020-03-07 VITALS — BP 141/98 | HR 72 | Wt 219.4 lb

## 2020-03-07 DIAGNOSIS — O0991 Supervision of high risk pregnancy, unspecified, first trimester: Secondary | ICD-10-CM | POA: Insufficient documentation

## 2020-03-07 DIAGNOSIS — O10919 Unspecified pre-existing hypertension complicating pregnancy, unspecified trimester: Secondary | ICD-10-CM

## 2020-03-07 DIAGNOSIS — E669 Obesity, unspecified: Secondary | ICD-10-CM | POA: Insufficient documentation

## 2020-03-07 DIAGNOSIS — Z3481 Encounter for supervision of other normal pregnancy, first trimester: Secondary | ICD-10-CM

## 2020-03-07 DIAGNOSIS — F17200 Nicotine dependence, unspecified, uncomplicated: Secondary | ICD-10-CM | POA: Insufficient documentation

## 2020-03-07 DIAGNOSIS — O99210 Obesity complicating pregnancy, unspecified trimester: Secondary | ICD-10-CM

## 2020-03-07 DIAGNOSIS — O09291 Supervision of pregnancy with other poor reproductive or obstetric history, first trimester: Secondary | ICD-10-CM

## 2020-03-07 DIAGNOSIS — O99211 Obesity complicating pregnancy, first trimester: Secondary | ICD-10-CM

## 2020-03-07 DIAGNOSIS — Z3A10 10 weeks gestation of pregnancy: Secondary | ICD-10-CM

## 2020-03-07 DIAGNOSIS — Z87891 Personal history of nicotine dependence: Secondary | ICD-10-CM | POA: Insufficient documentation

## 2020-03-07 DIAGNOSIS — O10911 Unspecified pre-existing hypertension complicating pregnancy, first trimester: Secondary | ICD-10-CM

## 2020-03-07 DIAGNOSIS — O0990 Supervision of high risk pregnancy, unspecified, unspecified trimester: Secondary | ICD-10-CM

## 2020-03-07 DIAGNOSIS — O09211 Supervision of pregnancy with history of pre-term labor, first trimester: Secondary | ICD-10-CM

## 2020-03-07 DIAGNOSIS — Z8759 Personal history of other complications of pregnancy, childbirth and the puerperium: Secondary | ICD-10-CM | POA: Insufficient documentation

## 2020-03-07 MED ORDER — LABETALOL HCL 100 MG PO TABS: 100.0000 mg | ORAL_TABLET | Freq: Two times a day (BID) | ORAL | 3 refills | Status: DC

## 2020-03-07 MED ORDER — ASPIRIN EC 81 MG PO TBEC: 81.0000 mg | DELAYED_RELEASE_TABLET | Freq: Every day | ORAL | 2 refills | Status: DC

## 2020-03-07 NOTE — Patient Instructions (Signed)

## 2020-03-07 NOTE — Progress Notes (Signed)
History:   Brandy Mack is a 34 y.o. G3P1011 at [redacted]w[redacted]d by LMP c/w ulrasound today being seen today for her first obstetrical visit.  Her obstetrical history is significant for Gestational Hypertension, preterm delivery. Patient does intend to breast feed. Pregnancy history fully reviewed.  Patient reports fatigue, nausea and breast itching which she is managing with eczema lotion. This is a planned pregnancy. Patient and her fiance are very excited. 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  Former tobacco use.      HISTORY: OB History  Gravida Para Term Preterm AB Living  3 1 1  0 1 1  SAB TAB Ectopic Multiple Live Births  0 0 0 0 1    # Outcome Date GA Lbr Len/2nd Weight Sex Delivery Anes PTL Lv  3 Current           2 AB 04/27/19    U      1 Term 09/10/01   8 lb 8 oz (3.856 kg)  Vag-Spont   LIV    Last pap smear was done 07/2016 and was normal  Past Medical History:  Diagnosis Date  . Depression   . Elevated blood pressure   . Elevated blood pressure reading 05/16/2016  . Knee pain, acute 05/16/2016  . Positive urine pregnancy test 02/07/2020   History reviewed. No pertinent surgical history. Family History  Adopted: Yes  Problem Relation Age of Onset  . Hypertension Father   . Stroke Father   . Diabetes Father    Social History   Tobacco Use  . Smoking status: Former Smoker    Packs/day: 0.25    Types: Cigarettes  . Smokeless tobacco: Never Used  Substance Use Topics  . Alcohol use: Yes    Alcohol/week: 0.0 standard drinks    Comment: social  . Drug use: No   Allergies  Allergen Reactions  . Naproxen Itching and Swelling   Current Outpatient Medications on File Prior to Visit  Medication Sig Dispense Refill  . triamcinolone ointment (KENALOG) 0.5 % Apply 1 application topically 2 (two) times daily. (Patient not taking: Reported on 02/07/2020) 30 g 0   No current facility-administered  medications on file prior to visit.    Review of Systems Pertinent items noted in HPI and remainder of comprehensive ROS otherwise negative. Physical Exam:   Vitals:   03/07/20 1350 03/07/20 1446  BP: (!) 168/100 (!) 141/98  Pulse: 78 72  Weight: 219 lb 6.4 oz (99.5 kg)      Uterus:     Pelvic Exam: Perineum: no hemorrhoids, normal perineum   Vulva: normal external genitalia, no lesions   Vagina:  normal mucosa, normal discharge   Cervix: no lesions and normal, pap smear done.    Adnexa: normal adnexa and no mass, fullness, tenderness   Bony Pelvis: average  System: General: well-developed, well-nourished female in no acute distress   Breasts:  normal appearance, no masses or tenderness bilaterally   Skin: normal coloration and turgor, no rashes   Neurologic: oriented, normal, negative, normal mood   Extremities: normal strength, tone, and muscle mass, ROM of all joints is normal   HEENT PERRLA, extraocular movement intact and sclera clear, anicteric   Mouth/Teeth mucous membranes moist, pharynx normal without lesions and dental hygiene good   Neck supple and no masses   Cardiovascular: regular rate and rhythm   Respiratory:  no respiratory distress, normal breath sounds   Abdomen: soft,  non-tender; bowel sounds normal; no masses,  no organomegaly  Bedside Ultrasound for FHR check: Patient informed that the ultrasound is considered a limited obstetric ultrasound and is not intended to be a complete ultrasound exam.  Patient also informed that the ultrasound is not being completed with the intent of assessing for fetal or placental anomalies or any pelvic abnormalities.  Explained that the purpose of today's ultrasound is to assess for fetal heart rate.  Patient acknowledges the purpose of the exam and the limitations of the study.     Assessment:    Pregnancy: G3P1011 Patient Active Problem List   Diagnosis Date Noted  . History of gestational hypertension 03/07/2020  .  Chronic hypertension 03/07/2020  . Supervision of high risk pregnancy, antepartum 03/07/2020  . Obesity in pregnancy 03/07/2020  . Preventative health care 08/18/2016     Plan:    1. Supervision of high risk pregnancy, antepartum - Welcome to practice. Discussed overarching schedule of appointments, blend of virtual and in-person - Obstetric Panel, Including HIV - Genetic Screening - Hep C Antibody - Culture, OB Urine - Cytology - PAP( [place]) - Babyscripts Schedule Optimization - Cervicovaginal ancillary only( [place]) - TSH - Hemoglobin A1c - Comprehensive metabolic panel - Protein / creatinine ratio, urine   2. Chronic hypertension affecting pregnancy - Severe range on arrival, improved but elevated on recheck at end of appointment - 142/90 at PCP 04/2016 - Never on blood pressure medication per patient - aspirin EC 81 MG tablet; Take 1 tablet (81 mg total) by mouth daily. Take after 12 weeks for prevention of preeclampsia later in pregnancy  Dispense: 300 tablet; Refill: 2 - labetalol (NORMODYNE) 100 MG tablet; Take 1 tablet (100 mg total) by mouth 2 (two) times daily.  Dispense: 30 tablet; Refill: 3  3. Obesity in pregnancy - [employer] TWG 11-20 lbs  4. History of gestational hypertension - With younger son, no preeclampsia, not discharged on medication   Initial labs drawn. Continue prenatal vitamins. Genetic Screening discussed, First trimester screen, Quad screen and NIPS: ordered. Ultrasound discussed; fetal anatomic survey: ordered. Problem list reviewed and updated. The nature of [facility] with multiple MDs and other Advanced Practice Providers was explained to patient; also emphasized that residents, students are part of our team. Routine obstetric precautions reviewed. Return in about 1 week for BP check, 4 weeks (around 04/04/2020) for MD next visit.    Mallie Snooks, MSN, CNM Certified Nurse Midwife, [facility] for [employer], [facility] 03/07/20 3:39 PM

## 2020-03-07 NOTE — Progress Notes (Signed)
DATING AND VIABILITY SONOGRAM   Brandy Mack is a 34 y.o. year old G64P1011 with LMP Patient's last menstrual period was 12/25/2019. which would correlate to  [redacted]w[redacted]d weeks gestation.  She has regular menstrual cycles.   She is here today for a confirmatory initial sonogram.    GESTATION: SINGLETON yes     FETAL ACTIVITY:          Heart rate         160          The fetus is active.  GESTATIONAL AGE AND  BIOMETRICS:  Gestational criteria: Estimated Date of Delivery: 09/30/20 by LMP now at [redacted]w[redacted]d  Previous Scans:0  GESTATIONAL SAC            mm         weeks  CROWN RUMP LENGTH          4.65 cm         11.3 weeks                                                   AVERAGE EGA(BY THIS SCAN):  11.3 weeks  WORKING EDD( LMP ):  09/30/2020     TECHNICIAN COMMENTS:  Patient informed that the ultrasound is considered a limited obstetric ultrasound and is not intended to be a complete ultrasound exam. Patient also informed that the ultrasound is not being completed with the intent of assessing for fetal or placental anomalies or any pelvic abnormalities. Explained that the purpose of today's ultrasound is to assess for fetal heart rate. Patient acknowledges the purpose of the exam and the limitations of the study.        A copy of this report including all images has been saved and backed up to a second source for retrieval if needed. All measures and details of the anatomical scan, placentation, fluid volume and pelvic anatomy are contained in that report.  Crosby Oyster 03/07/2020 2:36 PM

## 2020-03-08 LAB — OBSTETRIC PANEL, INCLUDING HIV
Antibody Screen: NEGATIVE
Basophils Absolute: 0 10*3/uL (ref 0.0–0.2)
Basos: 0 %
EOS (ABSOLUTE): 0.1 10*3/uL (ref 0.0–0.4)
Eos: 1 %
HIV Screen 4th Generation wRfx: NONREACTIVE
Hematocrit: 39.5 % (ref 34.0–46.6)
Hemoglobin: 13.4 g/dL (ref 11.1–15.9)
Hepatitis B Surface Ag: NEGATIVE
Immature Grans (Abs): 0.1 10*3/uL (ref 0.0–0.1)
Immature Granulocytes: 1 %
Lymphocytes Absolute: 1.9 10*3/uL (ref 0.7–3.1)
Lymphs: 22 %
MCH: 28.3 pg (ref 26.6–33.0)
MCHC: 33.9 g/dL (ref 31.5–35.7)
MCV: 84 fL (ref 79–97)
Monocytes Absolute: 0.5 10*3/uL (ref 0.1–0.9)
Monocytes: 6 %
Neutrophils Absolute: 6.2 10*3/uL (ref 1.4–7.0)
Neutrophils: 70 %
Platelets: 240 10*3/uL (ref 150–450)
RBC: 4.73 x10E6/uL (ref 3.77–5.28)
RDW: 12.8 % (ref 11.7–15.4)
RPR Ser Ql: NONREACTIVE
Rh Factor: POSITIVE
Rubella Antibodies, IGG: 2.66 index (ref >0.99)
WBC: 8.9 10*3/uL (ref 3.4–10.8)

## 2020-03-08 LAB — COMPREHENSIVE METABOLIC PANEL
ALT: 17 IU/L (ref 0–32)
AST: 17 IU/L (ref 0–40)
Albumin/Globulin Ratio: 1.8 (ref 1.2–2.2)
Albumin: 4.3 g/dL (ref 3.8–4.8)
Alkaline Phosphatase: 51 IU/L (ref 39–117)
BUN/Creatinine Ratio: 16 (ref 9–23)
BUN: 10 mg/dL (ref 6–20)
Bilirubin Total: <0.2 mg/dL (ref 0.0–1.2)
CO2: 21 mmol/L (ref 20–29)
Calcium: 9.1 mg/dL (ref 8.7–10.2)
Chloride: 102 mmol/L (ref 96–106)
Creatinine, Ser: 0.62 mg/dL (ref 0.57–1.00)
GFR calc Af Amer: 136 mL/min/{1.73_m2} (ref >59)
GFR calc non Af Amer: 118 mL/min/{1.73_m2} (ref >59)
Globulin, Total: 2.4 g/dL (ref 1.5–4.5)
Glucose: 83 mg/dL (ref 65–99)
Potassium: 3.9 mmol/L (ref 3.5–5.2)
Sodium: 138 mmol/L (ref 134–144)
Total Protein: 6.7 g/dL (ref 6.0–8.5)

## 2020-03-08 LAB — CERVICOVAGINAL ANCILLARY ONLY
Bacterial Vaginitis (gardnerella): NEGATIVE
Candida Glabrata: NEGATIVE
Candida Vaginitis: NEGATIVE
Chlamydia: NEGATIVE
Comment: NEGATIVE
Comment: NEGATIVE
Comment: NEGATIVE
Comment: NEGATIVE
Comment: NEGATIVE
Comment: NORMAL
Neisseria Gonorrhea: NEGATIVE
Trichomonas: NEGATIVE

## 2020-03-08 LAB — CYTOLOGY - PAP
Comment: NEGATIVE
Diagnosis: NEGATIVE
High risk HPV: NEGATIVE

## 2020-03-08 LAB — PROTEIN / CREATININE RATIO, URINE
Creatinine, Urine: 89.3 mg/dL
Protein, Ur: 6.2 mg/dL
Protein/Creat Ratio: 69 mg/g creat (ref 0–200)

## 2020-03-08 LAB — HEMOGLOBIN A1C
Est. average glucose Bld gHb Est-mCnc: 105 mg/dL
Hgb A1c MFr Bld: 5.3 % (ref 4.8–5.6)

## 2020-03-08 LAB — TSH
TSH: 0.94 u[IU]/mL (ref 0.450–4.500)
TSH: 0.951 u[IU]/mL (ref 0.450–4.500)

## 2020-03-08 LAB — HEPATITIS C ANTIBODY: Hep C Virus Ab: <0.1 s/co ratio (ref 0.0–0.9)

## 2020-03-09 LAB — URINE CULTURE, OB REFLEX

## 2020-03-09 LAB — CULTURE, OB URINE

## 2020-03-12 ENCOUNTER — Other Ambulatory Visit: Payer: Self-pay

## 2020-03-12 ENCOUNTER — Ambulatory Visit (INDEPENDENT_AMBULATORY_CARE_PROVIDER_SITE_OTHER): Payer: BC Managed Care – PPO

## 2020-03-12 VITALS — BP 137/85 | HR 78

## 2020-03-12 DIAGNOSIS — Z0130 Encounter for examination of blood pressure without abnormal findings: Secondary | ICD-10-CM

## 2020-03-12 DIAGNOSIS — O0990 Supervision of high risk pregnancy, unspecified, unspecified trimester: Secondary | ICD-10-CM

## 2020-03-12 NOTE — Progress Notes (Signed)
Subjective:  Brandy Mack is a 34 y.o. female here for BP check.   Hypertension ROS: taking medications as instructed, no medication side effects noted, no TIA, no complaints of chest pain or swelling of ankles.    Objective:  LMP 12/25/2019   Appearance alert, well appearing, and in no distress. General exam BP noted to be well controlled today in office.    Assessment:   Blood Pressure: Disease control with blood pressure medication.  Plan:  Continued to monitor blood pressure and take medication as prescribed.

## 2020-03-12 NOTE — Progress Notes (Signed)
Patient was assessed and managed by nursing staff during this encounter. I have reviewed the chart and agree with the documentation and plan. I have also made any necessary editorial changes.  Verita Schneiders, MD 03/12/2020 9:02 AM

## 2020-03-15 ENCOUNTER — Encounter: Payer: Self-pay | Admitting: Radiology

## 2020-03-15 ENCOUNTER — Telehealth: Payer: Self-pay | Admitting: Radiology

## 2020-03-15 NOTE — Telephone Encounter (Signed)
Called and left voicemail for patient to call [facility] for [facility] genetic screening results. Results scanned

## 2020-04-05 ENCOUNTER — Ambulatory Visit (INDEPENDENT_AMBULATORY_CARE_PROVIDER_SITE_OTHER): Payer: BC Managed Care – PPO | Admitting: Obstetrics and Gynecology

## 2020-04-05 ENCOUNTER — Other Ambulatory Visit: Payer: Self-pay

## 2020-04-05 VITALS — BP 135/93 | HR 74 | Wt 223.0 lb

## 2020-04-05 DIAGNOSIS — O09522 Supervision of elderly multigravida, second trimester: Secondary | ICD-10-CM

## 2020-04-05 DIAGNOSIS — O99210 Obesity complicating pregnancy, unspecified trimester: Secondary | ICD-10-CM

## 2020-04-05 DIAGNOSIS — O09523 Supervision of elderly multigravida, third trimester: Secondary | ICD-10-CM | POA: Insufficient documentation

## 2020-04-05 DIAGNOSIS — E6609 Other obesity due to excess calories: Secondary | ICD-10-CM | POA: Insufficient documentation

## 2020-04-05 DIAGNOSIS — I10 Essential (primary) hypertension: Secondary | ICD-10-CM

## 2020-04-05 DIAGNOSIS — O10912 Unspecified pre-existing hypertension complicating pregnancy, second trimester: Secondary | ICD-10-CM

## 2020-04-05 DIAGNOSIS — Z6841 Body Mass Index (BMI) 40.0 and over, adult: Secondary | ICD-10-CM

## 2020-04-05 DIAGNOSIS — Z3A14 14 weeks gestation of pregnancy: Secondary | ICD-10-CM

## 2020-04-05 DIAGNOSIS — O09529 Supervision of elderly multigravida, unspecified trimester: Secondary | ICD-10-CM

## 2020-04-05 DIAGNOSIS — O10919 Unspecified pre-existing hypertension complicating pregnancy, unspecified trimester: Secondary | ICD-10-CM

## 2020-04-05 DIAGNOSIS — O99212 Obesity complicating pregnancy, second trimester: Secondary | ICD-10-CM

## 2020-04-05 DIAGNOSIS — O0990 Supervision of high risk pregnancy, unspecified, unspecified trimester: Secondary | ICD-10-CM

## 2020-04-05 DIAGNOSIS — Z8759 Personal history of other complications of pregnancy, childbirth and the puerperium: Secondary | ICD-10-CM

## 2020-04-05 DIAGNOSIS — O0992 Supervision of high risk pregnancy, unspecified, second trimester: Secondary | ICD-10-CM

## 2020-04-05 MED ORDER — LABETALOL HCL 100 MG PO TABS: 200.0000 mg | ORAL_TABLET | Freq: Two times a day (BID) | ORAL | 3 refills | Status: DC

## 2020-04-05 NOTE — Progress Notes (Signed)
Prenatal Visit Note Date: 04/05/2020 Clinic: [facility]  Subjective:  Brandy Mack is a 34 y.o. G3P1011 at [redacted]w[redacted]d being seen today for ongoing prenatal care.  She is currently monitored for the following issues for this high-risk pregnancy and has Preventative health care; History of gestational hypertension; Chronic hypertension; Supervision of high risk pregnancy, antepartum; Obesity in pregnancy; Former tobacco use; AMA (advanced maternal age) multigravida 36+, unspecified trimester; and BMI 40.0-44.9, adult ([place]) on their problem list.  Patient reports no complaints.   Contractions: Not present. Vag. Bleeding: None.  Movement: Absent. Denies leaking of fluid.   The following portions of the patient's history were reviewed and updated as appropriate: allergies, current medications, past family history, past medical history, past social history, past surgical history and problem list. Problem list updated.  Objective:   Vitals:   04/05/20 0858  BP: (!) 135/93  Pulse: 74  Weight: 223 lb (101.2 kg)    Fetal Status: Fetal Heart Rate (bpm): 140s   Movement: Absent     General:  Alert, oriented and cooperative. Patient is in no acute distress.  Skin: Skin is warm and dry. No rash noted.   Cardiovascular: Normal heart rate noted  Respiratory: Normal respiratory effort, no problems with respiration noted  Abdomen: Soft, gravid, appropriate for gestational age. Pain/Pressure: Absent     Pelvic:  Cervical exam deferred        Extremities: Normal range of motion.  Edema: None  Mental Status: Normal mood and affect. Normal behavior. Normal judgment and thought content.   Urinalysis:      Assessment and Plan:  Pregnancy: G3P1011 at [redacted]w[redacted]d  1. AMA (advanced maternal age) multigravida 46+, unspecified trimester Low risk panorama. Offer afp nv - [place] MFM OB DETAIL +14 WK; Future  2. Chronic hypertension Increase labetalol from 100 bid to 200 id  3. Supervision of  high risk pregnancy, antepartum Anatomy u/s scheduled in one month Pt confirms low dose asa - [place] MFM OB DETAIL +14 WK; Future  4. BMI 40.0-44.9, adult ([facility]) No isues  5. History of gestational hypertension  6. Obesity in pregnancy  7. Chronic hypertension affecting pregnancy - labetalol (NORMODYNE) 100 MG tablet; Take 2 tablets (200 mg total) by mouth 2 (two) times daily.  Dispense: 30 tablet; Refill: 3  8. [redacted] weeks gestation of pregnancy  Preterm labor symptoms and general obstetric precautions including but not limited to vaginal bleeding, contractions, leaking of fluid and fetal movement were reviewed in detail with the patient. Please refer to After Visit Summary for other counseling recommendations.  Return in about 1 month (around 05/05/2020) for high risk, in person or virtual.   Aletha Halim, MD

## 2020-05-03 ENCOUNTER — Other Ambulatory Visit: Payer: Self-pay

## 2020-05-03 ENCOUNTER — Ambulatory Visit (INDEPENDENT_AMBULATORY_CARE_PROVIDER_SITE_OTHER): Payer: BC Managed Care – PPO | Admitting: Obstetrics and Gynecology

## 2020-05-03 VITALS — BP 128/84 | HR 78 | Wt 223.0 lb

## 2020-05-03 DIAGNOSIS — Z3A18 18 weeks gestation of pregnancy: Secondary | ICD-10-CM

## 2020-05-03 DIAGNOSIS — E669 Obesity, unspecified: Secondary | ICD-10-CM

## 2020-05-03 DIAGNOSIS — G4700 Insomnia, unspecified: Secondary | ICD-10-CM

## 2020-05-03 DIAGNOSIS — O09522 Supervision of elderly multigravida, second trimester: Secondary | ICD-10-CM

## 2020-05-03 DIAGNOSIS — Z6841 Body Mass Index (BMI) 40.0 and over, adult: Secondary | ICD-10-CM

## 2020-05-03 DIAGNOSIS — I10 Essential (primary) hypertension: Secondary | ICD-10-CM

## 2020-05-03 DIAGNOSIS — O10912 Unspecified pre-existing hypertension complicating pregnancy, second trimester: Secondary | ICD-10-CM

## 2020-05-03 DIAGNOSIS — O99891 Other specified diseases and conditions complicating pregnancy: Secondary | ICD-10-CM

## 2020-05-03 DIAGNOSIS — O99212 Obesity complicating pregnancy, second trimester: Secondary | ICD-10-CM

## 2020-05-03 DIAGNOSIS — O0992 Supervision of high risk pregnancy, unspecified, second trimester: Secondary | ICD-10-CM

## 2020-05-03 DIAGNOSIS — O09529 Supervision of elderly multigravida, unspecified trimester: Secondary | ICD-10-CM

## 2020-05-03 DIAGNOSIS — O0990 Supervision of high risk pregnancy, unspecified, unspecified trimester: Secondary | ICD-10-CM

## 2020-05-03 NOTE — Progress Notes (Signed)
Prenatal Visit Note Date: 05/03/2020 Clinic: [facility]  Subjective:  Brandy Mack is a 34 y.o. G3P1011 at [redacted]w[redacted]d being seen today for ongoing prenatal care.  She is currently monitored for the following issues for this high-risk pregnancy and has Preventative health care; History of gestational hypertension; Chronic hypertension; Supervision of high risk pregnancy, antepartum; Obesity in pregnancy; Former tobacco use; AMA (advanced maternal age) multigravida 22+, unspecified trimester; and BMI 40.0-44.9, adult ([place]) on their problem list.  Patient reports sleep issues (see below).   Contractions: Not present. Vag. Bleeding: None.  Movement: Present. Denies leaking of fluid.   The following portions of the patient's history were reviewed and updated as appropriate: allergies, current medications, past family history, past medical history, past social history, past surgical history and problem list. Problem list updated.  Objective:   Vitals:   05/03/20 0820  BP: 128/84  Pulse: 78  Weight: 223 lb (101.2 kg)    Fetal Status: Fetal Heart Rate (bpm): 152   Movement: Present     General:  Alert, oriented and cooperative. Patient is in no acute distress.  Skin: Skin is warm and dry. No rash noted.   Cardiovascular: Normal heart rate noted  Respiratory: Normal respiratory effort, no problems with respiration noted  Abdomen: Soft, gravid, appropriate for gestational age. Pain/Pressure: Absent     Pelvic:  Cervical exam deferred        Extremities: Normal range of motion.  Edema: Trace  Mental Status: Normal mood and affect. Normal behavior. Normal judgment and thought content.   Urinalysis:      Assessment and Plan:  Pregnancy: G3P1011 at [redacted]w[redacted]d  1. Chronic hypertension Doing well on labetalol 200 bid and low dose asa  2. Supervision of high risk pregnancy, antepartum Anatomy u/s already scheduled. TED hoses/compression stockings - AFP, Serum, Open Spina  Bifida  3. AMA (advanced maternal age) multigravida 64+, unspecified trimester  4. BMI 40.0-44.9, adult ([place])  5. Sleep Difficulty falling asleep and staying asleep, has been going on since the beginning of pregnancy and no relation to labetalol. She has a cup of caffeine in the morning and has tried melatonin, warm baths and benadryl. I advised to try not to use benadryl b/c it can be counter-productive. Recommended continue melatonin, try tryptophan, good sleep and bedroom hygiene for rest and limiting fluids to 1800 to try and avoid getting up during the night  Preterm labor symptoms and general obstetric precautions including but not limited to vaginal bleeding, contractions, leaking of fluid and fetal movement were reviewed in detail with the patient. Please refer to After Visit Summary for other counseling recommendations.  Return in about 1 month (around 06/03/2020) for in person or virtual.   Aletha Halim, MD

## 2020-05-03 NOTE — Progress Notes (Signed)
Having problems sleeping

## 2020-05-03 NOTE — Patient Instructions (Signed)
TED hoses and/or compression stockings. Try to get a tightness of 20-48mmHg Pregnancy pillow

## 2020-05-04 LAB — AFP, SERUM, OPEN SPINA BIFIDA
AFP MoM: 0.92
AFP Value: 34 ng/mL
Gest. Age on Collection Date: 18.4 weeks
Maternal Age At EDD: 35.5 yr
OSBR Risk 1 IN: 10000
Test Results:: NEGATIVE
Weight: 223 [lb_av]

## 2020-05-07 ENCOUNTER — Other Ambulatory Visit: Payer: Self-pay | Admitting: *Deleted

## 2020-05-07 ENCOUNTER — Ambulatory Visit: Payer: BC Managed Care – PPO | Admitting: *Deleted

## 2020-05-07 ENCOUNTER — Ambulatory Visit: Payer: BC Managed Care – PPO | Attending: Obstetrics and Gynecology

## 2020-05-07 ENCOUNTER — Other Ambulatory Visit: Payer: Self-pay

## 2020-05-07 VITALS — BP 134/84 | HR 79

## 2020-05-07 DIAGNOSIS — O09529 Supervision of elderly multigravida, unspecified trimester: Secondary | ICD-10-CM | POA: Diagnosis present

## 2020-05-07 DIAGNOSIS — O99212 Obesity complicating pregnancy, second trimester: Secondary | ICD-10-CM

## 2020-05-07 DIAGNOSIS — O10912 Unspecified pre-existing hypertension complicating pregnancy, second trimester: Secondary | ICD-10-CM | POA: Diagnosis present

## 2020-05-07 DIAGNOSIS — O10012 Pre-existing essential hypertension complicating pregnancy, second trimester: Secondary | ICD-10-CM

## 2020-05-07 DIAGNOSIS — O3412 Maternal care for benign tumor of corpus uteri, second trimester: Secondary | ICD-10-CM

## 2020-05-07 DIAGNOSIS — Z3A19 19 weeks gestation of pregnancy: Secondary | ICD-10-CM

## 2020-05-07 DIAGNOSIS — O0990 Supervision of high risk pregnancy, unspecified, unspecified trimester: Secondary | ICD-10-CM

## 2020-05-07 DIAGNOSIS — O09522 Supervision of elderly multigravida, second trimester: Secondary | ICD-10-CM

## 2020-05-07 DIAGNOSIS — E669 Obesity, unspecified: Secondary | ICD-10-CM

## 2020-05-07 DIAGNOSIS — D259 Leiomyoma of uterus, unspecified: Secondary | ICD-10-CM

## 2020-05-07 DIAGNOSIS — Z363 Encounter for antenatal screening for malformations: Secondary | ICD-10-CM | POA: Diagnosis not present

## 2020-05-07 DIAGNOSIS — O10919 Unspecified pre-existing hypertension complicating pregnancy, unspecified trimester: Secondary | ICD-10-CM

## 2020-05-31 ENCOUNTER — Ambulatory Visit (INDEPENDENT_AMBULATORY_CARE_PROVIDER_SITE_OTHER): Payer: BC Managed Care – PPO | Admitting: Obstetrics & Gynecology

## 2020-05-31 ENCOUNTER — Other Ambulatory Visit: Payer: Self-pay

## 2020-05-31 ENCOUNTER — Encounter: Payer: Self-pay | Admitting: Obstetrics & Gynecology

## 2020-05-31 VITALS — BP 137/89 | HR 80 | Wt 225.0 lb

## 2020-05-31 DIAGNOSIS — O10912 Unspecified pre-existing hypertension complicating pregnancy, second trimester: Secondary | ICD-10-CM

## 2020-05-31 DIAGNOSIS — O0990 Supervision of high risk pregnancy, unspecified, unspecified trimester: Secondary | ICD-10-CM

## 2020-05-31 DIAGNOSIS — Z3A22 22 weeks gestation of pregnancy: Secondary | ICD-10-CM

## 2020-05-31 MED ORDER — LABETALOL HCL 200 MG PO TABS: 200.0000 mg | ORAL_TABLET | Freq: Two times a day (BID) | ORAL | 5 refills | Status: DC

## 2020-05-31 NOTE — Progress Notes (Signed)
   PRENATAL VISIT NOTE  Subjective:  Brandy Mack is a 34 y.o. [phone] at [redacted]w[redacted]d being seen today for ongoing prenatal care.  She is currently monitored for the following issues for this high-risk pregnancy and has Preventative health care; History of gestational hypertension; Chronic hypertension; Supervision of high risk pregnancy, antepartum; Obesity in pregnancy; Former tobacco use; AMA (advanced maternal age) multigravida 34+, unspecified trimester; and BMI 40.0-44.9, adult ([place]) on their problem list.  Patient reports no complaints.  Contractions: Not present. Vag. Bleeding: None.  Movement: Present. Denies leaking of fluid.   The following portions of the patient's history were reviewed and updated as appropriate: allergies, current medications, past family history, past medical history, past social history, past surgical history and problem list.   Objective:   Vitals:   05/31/20 0908  BP: 137/89  Pulse: 80  Weight: 225 lb (102.1 kg)    Fetal Status: Fetal Heart Rate (bpm): 145   Movement: Present     General:  Alert, oriented and cooperative. Patient is in no acute distress.  Skin: Skin is warm and dry. No rash noted.   Cardiovascular: Normal heart rate noted  Respiratory: Normal respiratory effort, no problems with respiration noted  Abdomen: Soft, gravid, appropriate for gestational age.  Pain/Pressure: Absent     Pelvic: Cervical exam deferred        Extremities: Normal range of motion.  Edema: Mild pitting, slight indentation  Mental Status: Normal mood and affect. Normal behavior. Normal judgment and thought content.   Assessment and Plan:  Pregnancy: G8T1572 at [redacted]w[redacted]d 1. Preexisting hypertension complicating pregnancy, antepartum, second trimester Stable BP on Labetolol, refilled as per patient request. Continue close monitoring. Growth scan ordered for next week.  - labetalol (NORMODYNE) 200 MG tablet; Take 1 tablet (200 mg total) by mouth 2 (two) times daily.   Dispense: 60 tablet; Refill: 5  2. [redacted] weeks gestation of pregnancy 3. Supervision of high risk pregnancy, antepartum Preterm labor symptoms and general obstetric precautions including but not limited to vaginal bleeding, contractions, leaking of fluid and fetal movement were reviewed in detail with the patient. Please refer to After Visit Summary for other counseling recommendations.   Return in about 4 weeks (around 06/28/2020) for OFFICE OB Visit.  Future Appointments  Date Time Provider [place]  06/04/2020 11:15 AM [facility] NURSE [place] [facility]  06/04/2020 11:30 AM [facility] US3 [facility] [place]    Verita Schneiders, MD

## 2020-05-31 NOTE — Patient Instructions (Signed)
Return to office for any scheduled appointments. Call the office or go to the MAU at [facility] if:  You begin to have strong, frequent contractions  Your water breaks.  Sometimes it is a big gush of fluid, sometimes it is just a trickle that keeps getting your panties wet or running down your legs  You have vaginal bleeding.  It is normal to have a small amount of spotting if your cervix was checked.   You do not feel your baby moving like normal.  If you do not, get something to eat and drink and lay down and focus on feeling your baby move.   If your baby is still not moving like normal, you should call the office or go to MAU.  Any other obstetric concerns.   

## 2020-06-04 ENCOUNTER — Other Ambulatory Visit: Payer: Self-pay | Admitting: *Deleted

## 2020-06-04 ENCOUNTER — Ambulatory Visit: Payer: BC Managed Care – PPO | Attending: Obstetrics and Gynecology

## 2020-06-04 ENCOUNTER — Ambulatory Visit: Payer: BC Managed Care – PPO | Admitting: *Deleted

## 2020-06-04 ENCOUNTER — Other Ambulatory Visit: Payer: Self-pay

## 2020-06-04 ENCOUNTER — Encounter: Payer: Self-pay | Admitting: *Deleted

## 2020-06-04 VITALS — BP 139/80 | HR 81

## 2020-06-04 DIAGNOSIS — Z3A23 23 weeks gestation of pregnancy: Secondary | ICD-10-CM

## 2020-06-04 DIAGNOSIS — O10919 Unspecified pre-existing hypertension complicating pregnancy, unspecified trimester: Secondary | ICD-10-CM

## 2020-06-04 DIAGNOSIS — E669 Obesity, unspecified: Secondary | ICD-10-CM

## 2020-06-04 DIAGNOSIS — O3412 Maternal care for benign tumor of corpus uteri, second trimester: Secondary | ICD-10-CM | POA: Diagnosis not present

## 2020-06-04 DIAGNOSIS — O99212 Obesity complicating pregnancy, second trimester: Secondary | ICD-10-CM

## 2020-06-04 DIAGNOSIS — O10012 Pre-existing essential hypertension complicating pregnancy, second trimester: Secondary | ICD-10-CM

## 2020-06-04 DIAGNOSIS — Z362 Encounter for other antenatal screening follow-up: Secondary | ICD-10-CM | POA: Diagnosis not present

## 2020-06-04 DIAGNOSIS — D259 Leiomyoma of uterus, unspecified: Secondary | ICD-10-CM

## 2020-06-04 DIAGNOSIS — I10 Essential (primary) hypertension: Secondary | ICD-10-CM

## 2020-06-04 DIAGNOSIS — O09522 Supervision of elderly multigravida, second trimester: Secondary | ICD-10-CM | POA: Diagnosis not present

## 2020-06-28 ENCOUNTER — Ambulatory Visit (INDEPENDENT_AMBULATORY_CARE_PROVIDER_SITE_OTHER): Payer: BC Managed Care – PPO | Admitting: Obstetrics & Gynecology

## 2020-06-28 ENCOUNTER — Encounter: Payer: Self-pay | Admitting: Radiology

## 2020-06-28 ENCOUNTER — Other Ambulatory Visit: Payer: Self-pay

## 2020-06-28 VITALS — BP 131/85 | HR 73 | Wt 228.0 lb

## 2020-06-28 DIAGNOSIS — O09529 Supervision of elderly multigravida, unspecified trimester: Secondary | ICD-10-CM

## 2020-06-28 DIAGNOSIS — O0990 Supervision of high risk pregnancy, unspecified, unspecified trimester: Secondary | ICD-10-CM

## 2020-06-28 DIAGNOSIS — Z3A26 26 weeks gestation of pregnancy: Secondary | ICD-10-CM

## 2020-06-28 DIAGNOSIS — O10912 Unspecified pre-existing hypertension complicating pregnancy, second trimester: Secondary | ICD-10-CM

## 2020-06-28 NOTE — Progress Notes (Signed)
   PRENATAL VISIT NOTE  Subjective:  Brandy Mack is a 34 y.o. [phone] at [redacted]w[redacted]d being seen today for ongoing prenatal care.  She is currently monitored for the following issues for this high-risk pregnancy and has Preventative health care; History of gestational hypertension; Chronic hypertension; Supervision of high risk pregnancy, antepartum; Obesity in pregnancy; Former tobacco use; AMA (advanced maternal age) multigravida 15+, unspecified trimester; and BMI 40.0-44.9, adult ([place]) on their problem list.  Patient reports no complaints.  Contractions: Irritability. Vag. Bleeding: None.  Movement: Present. Denies leaking of fluid.   The following portions of the patient's history were reviewed and updated as appropriate: allergies, current medications, past family history, past medical history, past social history, past surgical history and problem list.   Objective:   Vitals:   06/28/20 0822  BP: 131/85  Pulse: 73  Weight: 228 lb (103.4 kg)    Fetal Status: Fetal Heart Rate (bpm): 143   Movement: Present     General:  Alert, oriented and cooperative. Patient is in no acute distress.  Skin: Skin is warm and dry. No rash noted.   Cardiovascular: Normal heart rate noted  Respiratory: Normal respiratory effort, no problems with respiration noted  Abdomen: Soft, gravid, appropriate for gestational age.  Pain/Pressure: Absent     Pelvic: Cervical exam deferred        Extremities: Normal range of motion.  Edema: Mild pitting, slight indentation  Mental Status: Normal mood and affect. Normal behavior. Normal judgment and thought content.   Assessment and Plan:  Pregnancy: [id number] at [redacted]w[redacted]d 1. Preexisting hypertension complicating pregnancy, antepartum, second trimester Stable BP on Labetalol.  Will start antenatal testing in third trimester, serial scans as per MFM. Continue close monitoring. Next growth scan in 2 weeks.   2. [redacted] weeks gestation of pregnancy 3. AMA (advanced maternal  age) multigravida 56+, unspecified trimester 4. Supervision of high risk pregnancy, antepartum Preterm labor symptoms and general obstetric precautions including but not limited to vaginal bleeding, contractions, leaking of fluid and fetal movement were reviewed in detail with the patient. Please refer to After Visit Summary for other counseling recommendations.   Return in about 2 weeks (around 07/12/2020) for 3rd trimester labs, TDap, OFFICE OB Visit.  Future Appointments  Date Time Provider [place]  07/03/2020  7:15 AM [facility] NURSE [facility]  07/03/2020  7:30 AM [facility] US3 [facility]  07/10/2020  8:15 AM Issiah Huffaker, Sallyanne Havers, MD [facility]    Verita Schneiders, MD

## 2020-06-28 NOTE — Patient Instructions (Signed)
Return to office for any scheduled appointments. Call the office or go to the MAU at [facility] if:  You begin to have strong, frequent contractions  Your water breaks.  Sometimes it is a big gush of fluid, sometimes it is just a trickle that keeps getting your panties wet or running down your legs  You have vaginal bleeding.  It is normal to have a small amount of spotting if your cervix was checked.   You do not feel your baby moving like normal.  If you do not, get something to eat and drink and lay down and focus on feeling your baby move.   If your baby is still not moving like normal, you should call the office or go to MAU.  Any other obstetric concerns.   

## 2020-07-03 ENCOUNTER — Other Ambulatory Visit: Payer: Self-pay

## 2020-07-03 ENCOUNTER — Encounter: Payer: Self-pay | Admitting: *Deleted

## 2020-07-03 ENCOUNTER — Ambulatory Visit: Payer: BC Managed Care – PPO | Attending: Obstetrics and Gynecology

## 2020-07-03 ENCOUNTER — Ambulatory Visit: Payer: BC Managed Care – PPO | Admitting: *Deleted

## 2020-07-03 ENCOUNTER — Other Ambulatory Visit: Payer: Self-pay | Admitting: *Deleted

## 2020-07-03 VITALS — BP 135/86 | HR 70

## 2020-07-03 DIAGNOSIS — O3412 Maternal care for benign tumor of corpus uteri, second trimester: Secondary | ICD-10-CM

## 2020-07-03 DIAGNOSIS — I10 Essential (primary) hypertension: Secondary | ICD-10-CM

## 2020-07-03 DIAGNOSIS — Z362 Encounter for other antenatal screening follow-up: Secondary | ICD-10-CM

## 2020-07-03 DIAGNOSIS — D259 Leiomyoma of uterus, unspecified: Secondary | ICD-10-CM

## 2020-07-03 DIAGNOSIS — O10012 Pre-existing essential hypertension complicating pregnancy, second trimester: Secondary | ICD-10-CM

## 2020-07-03 DIAGNOSIS — E669 Obesity, unspecified: Secondary | ICD-10-CM

## 2020-07-03 DIAGNOSIS — O10919 Unspecified pre-existing hypertension complicating pregnancy, unspecified trimester: Secondary | ICD-10-CM

## 2020-07-03 DIAGNOSIS — O09522 Supervision of elderly multigravida, second trimester: Secondary | ICD-10-CM

## 2020-07-03 DIAGNOSIS — Z3A27 27 weeks gestation of pregnancy: Secondary | ICD-10-CM

## 2020-07-03 DIAGNOSIS — O99212 Obesity complicating pregnancy, second trimester: Secondary | ICD-10-CM

## 2020-07-03 DIAGNOSIS — Z6841 Body Mass Index (BMI) 40.0 and over, adult: Secondary | ICD-10-CM

## 2020-07-10 ENCOUNTER — Other Ambulatory Visit: Payer: Self-pay

## 2020-07-10 ENCOUNTER — Ambulatory Visit (INDEPENDENT_AMBULATORY_CARE_PROVIDER_SITE_OTHER): Payer: BC Managed Care – PPO | Admitting: Obstetrics & Gynecology

## 2020-07-10 ENCOUNTER — Encounter: Payer: Self-pay | Admitting: Obstetrics & Gynecology

## 2020-07-10 VITALS — BP 133/88 | HR 79 | Wt 231.0 lb

## 2020-07-10 DIAGNOSIS — O99210 Obesity complicating pregnancy, unspecified trimester: Secondary | ICD-10-CM

## 2020-07-10 DIAGNOSIS — O10913 Unspecified pre-existing hypertension complicating pregnancy, third trimester: Secondary | ICD-10-CM

## 2020-07-10 DIAGNOSIS — Z3A28 28 weeks gestation of pregnancy: Secondary | ICD-10-CM

## 2020-07-10 DIAGNOSIS — Z23 Encounter for immunization: Secondary | ICD-10-CM

## 2020-07-10 DIAGNOSIS — O0990 Supervision of high risk pregnancy, unspecified, unspecified trimester: Secondary | ICD-10-CM

## 2020-07-10 LAB — CBC
Hematocrit: 33.4 % — ABNORMAL LOW (ref 34.0–46.6)
Hemoglobin: 11.2 g/dL (ref 11.1–15.9)
MCH: 28 pg (ref 26.6–33.0)
MCHC: 33.5 g/dL (ref 31.5–35.7)
MCV: 84 fL (ref 79–97)
Platelets: 210 10*3/uL (ref 150–450)
RBC: 4 x10E6/uL (ref 3.77–5.28)
RDW: 13.3 % (ref 11.7–15.4)
WBC: 7.9 10*3/uL (ref 3.4–10.8)

## 2020-07-10 NOTE — Patient Instructions (Signed)
Return to office for any scheduled appointments. Call the office or go to the MAU at [facility] [place] if:  You begin to have strong, frequent contractions  Your water breaks.  Sometimes it is a big gush of fluid, sometimes it is just a trickle that keeps getting your panties wet or running down your legs  You have vaginal bleeding.  It is normal to have a small amount of spotting if your cervix was checked.   You do not feel your baby moving like normal.  If you do not, get something to eat and drink and lay down and focus on feeling your baby move.   If your baby is still not moving like normal, you should call the office or go to MAU.  Any other obstetric concerns.   

## 2020-07-10 NOTE — Progress Notes (Signed)
PRENATAL VISIT NOTE  Subjective:  Brandy Mack is a 34 y.o. [phone] at [redacted]w[redacted]d being seen today for ongoing prenatal care.  She is currently monitored for the following issues for this high-risk pregnancy and has Preventative health care; History of gestational hypertension; Chronic hypertension; Supervision of high risk pregnancy, antepartum; Maternal morbid obesity, antepartum ([place]); Former tobacco use; AMA (advanced maternal age) multigravida 27+, unspecified trimester; and BMI 40.0-44.9, adult ([place]) on their problem list.  Patient reports no complaints.  Contractions: Irritability. Vag. Bleeding: None.  Movement: Present. Denies leaking of fluid.   The following portions of the patient's history were reviewed and updated as appropriate: allergies, current medications, past family history, past medical history, past social history, past surgical history and problem list.   Objective:   Vitals:   07/10/20 0824  BP: 133/88  Pulse: 79  Weight: 231 lb (104.8 kg)    Fetal Status: Fetal Heart Rate (bpm): 140   Movement: Present     General:  Alert, oriented and cooperative. Patient is in no acute distress.  Skin: Skin is warm and dry. No rash noted.   Cardiovascular: Normal heart rate noted  Respiratory: Normal respiratory effort, no problems with respiration noted  Abdomen: Soft, gravid, appropriate for gestational age.  Pain/Pressure: Absent     Pelvic: Cervical exam deferred        Extremities: Normal range of motion.  Edema: Mild pitting, slight indentation  Mental Status: Normal mood and affect. Normal behavior. Normal judgment and thought content.   Assessment and Plan:  Pregnancy: [id number] at [redacted]w[redacted]d 1. Preexisting hypertension complicating pregnancy, antepartum, third trimester Stable BP on Labetalol and ASA. Serial scans as per MFM. Antenatal testing at 32 weeks. Delivery plan as per protocol.  CMET checked today with other third trimester labs. - Comprehensive metabolic  panel  2. Maternal morbid obesity, antepartum ([facility]) TWG 0 lbs, doing well  3. [redacted] weeks gestation of pregnancy 4. Supervision of high risk pregnancy, antepartum Third trimester labs today. Tdap vaccine given, will get flu shot next visit. The patient was counseled on the potential benefits and lack of known risks of COVID vaccination, during pregnancy and breastfeeding, during today's visit. The patient's questions and concerns were addressed today, including safety of the vaccination and potential side effects as they have been published by ACOG and SMFM. The patient has been informed that there have not been any documented vaccine related injuries, deaths or birth defects to infant or mom after receiving the COVID-19 vaccine to date. The patient has been made aware that although she is not at increased risk of contracting COVID-19 during pregnancy, she is at increased risk of developing severe disease and complications if she contracts COVID-19 while pregnant. All patient questions were addressed during our visit today. The patient is still unsure of her decision for vaccination.  - Glucose Tolerance, 2 Hours w/1 Hour - CBC - RPR - HIV Antibody (routine testing w rflx) Preterm labor symptoms and general obstetric precautions including but not limited to vaginal bleeding, contractions, leaking of fluid and fetal movement were reviewed in detail with the patient. Please refer to After Visit Summary for other counseling recommendations.   Return in about 2 weeks (around 07/24/2020) for OFFICE OB Visit and Flu Shot.  Future Appointments  Date Time Provider [place]  07/31/2020  3:30 PM [facility] NURSE [facility]  07/31/2020  3:45 PM [facility] US4 [facility]  08/07/2020  1:30 PM [facility] NURSE [facility]  08/07/2020  1:45 PM [facility]  US4 [facility]  08/15/2020  1:30 PM [facility] NURSE [facility]  08/15/2020  1:45 PM [facility] US4 [facility]  08/21/2020  1:30 PM [facility] NURSE [facility]   08/21/2020  1:45 PM [facility] US4 [facility] [place]    Verita Schneiders, MD

## 2020-07-11 ENCOUNTER — Encounter: Payer: Self-pay | Admitting: Obstetrics & Gynecology

## 2020-07-11 DIAGNOSIS — O24410 Gestational diabetes mellitus in pregnancy, diet controlled: Secondary | ICD-10-CM

## 2020-07-11 HISTORY — DX: Gestational diabetes mellitus in pregnancy, diet controlled: O24.410

## 2020-07-11 LAB — COMPREHENSIVE METABOLIC PANEL
ALT: 19 IU/L (ref 0–32)
AST: 15 IU/L (ref 0–40)
Albumin/Globulin Ratio: 1.5 (ref 1.2–2.2)
Albumin: 3.4 g/dL — ABNORMAL LOW (ref 3.8–4.8)
Alkaline Phosphatase: 62 IU/L (ref 44–121)
BUN/Creatinine Ratio: 12 (ref 9–23)
BUN: 7 mg/dL (ref 6–20)
Bilirubin Total: <0.2 mg/dL (ref 0.0–1.2)
CO2: 17 mmol/L — ABNORMAL LOW (ref 20–29)
Calcium: 8.2 mg/dL — ABNORMAL LOW (ref 8.7–10.2)
Chloride: 107 mmol/L — ABNORMAL HIGH (ref 96–106)
Creatinine, Ser: 0.57 mg/dL (ref 0.57–1.00)
GFR calc Af Amer: 139 mL/min/{1.73_m2} (ref >59)
GFR calc non Af Amer: 120 mL/min/{1.73_m2} (ref >59)
Globulin, Total: 2.2 g/dL (ref 1.5–4.5)
Glucose: 199 mg/dL — ABNORMAL HIGH (ref 65–99)
Potassium: 3.9 mmol/L (ref 3.5–5.2)
Sodium: 138 mmol/L (ref 134–144)
Total Protein: 5.6 g/dL — ABNORMAL LOW (ref 6.0–8.5)

## 2020-07-11 LAB — RPR: RPR Ser Ql: NONREACTIVE

## 2020-07-11 LAB — GLUCOSE TOLERANCE, 2 HOURS W/ 1HR
Glucose, 1 hour: 201 mg/dL — ABNORMAL HIGH (ref 65–179)
Glucose, 2 hour: 131 mg/dL (ref 65–152)
Glucose, Fasting: 83 mg/dL (ref 65–91)

## 2020-07-11 LAB — HIV ANTIBODY (ROUTINE TESTING W REFLEX): HIV Screen 4th Generation wRfx: NONREACTIVE

## 2020-07-12 ENCOUNTER — Telehealth: Payer: Self-pay | Admitting: *Deleted

## 2020-07-12 DIAGNOSIS — O24419 Gestational diabetes mellitus in pregnancy, unspecified control: Secondary | ICD-10-CM

## 2020-07-12 DIAGNOSIS — O0990 Supervision of high risk pregnancy, unspecified, unspecified trimester: Secondary | ICD-10-CM

## 2020-07-12 MED ORDER — ACCU-CHEK GUIDE W/DEVICE KIT: 1.0000 | PACK | Freq: Four times a day (QID) | 0 refills | Status: DC

## 2020-07-12 MED ORDER — ACCU-CHEK GUIDE VI STRP: ORAL_STRIP | 12 refills | Status: DC

## 2020-07-12 MED ORDER — ACCU-CHEK SOFTCLIX LANCETS MISC: 1.0000 | Freq: Four times a day (QID) | 12 refills | Status: DC

## 2020-07-12 NOTE — Telephone Encounter (Signed)
-----   Message from Osborne Oman, MD sent at 07/11/2020  2:26 PM EDT ----- Patient's 2 hr GTT is abnormal and is consistent with Gestational Diabetes [ ]  GDM education and testing supplies [ ]  Nutrition consult [ ]  Enroll in [employer] Diabetes Program Please call to inform patient of results and recommendations.

## 2020-07-12 NOTE — Telephone Encounter (Signed)
Pt informed of GDM results. Informed that we will send in supplies and a referral to nutrition. Pt verbalizes and understands. Also will add pt to [employer].

## 2020-07-20 ENCOUNTER — Encounter: Payer: BC Managed Care – PPO | Attending: Obstetrics & Gynecology | Admitting: *Deleted

## 2020-07-20 ENCOUNTER — Encounter: Payer: Self-pay | Admitting: *Deleted

## 2020-07-20 ENCOUNTER — Other Ambulatory Visit: Payer: Self-pay

## 2020-07-20 VITALS — BP 108/80 | Ht 63.0 in | Wt 231.3 lb

## 2020-07-20 DIAGNOSIS — Z3A00 Weeks of gestation of pregnancy not specified: Secondary | ICD-10-CM | POA: Insufficient documentation

## 2020-07-20 DIAGNOSIS — O24419 Gestational diabetes mellitus in pregnancy, unspecified control: Secondary | ICD-10-CM | POA: Diagnosis present

## 2020-07-20 DIAGNOSIS — O0990 Supervision of high risk pregnancy, unspecified, unspecified trimester: Secondary | ICD-10-CM | POA: Diagnosis not present

## 2020-07-20 DIAGNOSIS — O24410 Gestational diabetes mellitus in pregnancy, diet controlled: Secondary | ICD-10-CM

## 2020-07-20 NOTE — Patient Instructions (Signed)
Read booklet on Gestational Diabetes Follow Gestational Meal Planning Guidelines Don't skip meals (eat at least 1 serving of protein and 1 serving of carbohydrate) Limit desserts/sweets Complete a 3 Day Food Record and bring to next appointment Check blood sugars 4 x day - before breakfast and 2 hrs after every meal and record  Bring blood sugar log to all appointments Purchase urine ketone strips if instructed by MD and check urine ketones every am:  If + increase bedtime snack to 1 protein and 2 carbohydrate servings Walk 20-30 minutes at least 5 x week if permitted by MD

## 2020-07-20 NOTE — Progress Notes (Signed)
Diabetes Self-Management Education  Visit Type: First/Initial  Appt. Start Time: 1320 Appt. End Time: 3419  07/20/2020  [name], identified by name and date of birth, is a 34 y.o. female with a diagnosis of Diabetes: Gestational Diabetes.   ASSESSMENT  Blood pressure 108/80, height 5\' 3"  (1.6 m), weight 231 lb 4.8 oz (104.9 kg), last menstrual period 12/25/2019, estimated date of delivery 09/30/2020 Body mass index is 40.97 kg/m.   Diabetes Self-Management Education - 07/20/20 1606      Visit Information   Visit Type First/Initial      Initial Visit   Diabetes Type Gestational Diabetes    Are you currently following a meal plan? No    Are you taking your medications as prescribed? Yes    Date Diagnosed 1 week ago      Health Coping   How would you rate your overall health? Good      Psychosocial Assessment   Patient Belief/Attitude about Diabetes Other (comment)   "worried"   Self-care barriers None    Self-management support Doctor's office;Family    Patient Concerns Nutrition/Meal planning;Glycemic Control;Weight Control;Healthy Lifestyle    Special Needs None    Preferred Learning Style Visual;Auditory    Learning Readiness Ready    How often do you need to have someone help you when you read instructions, pamphlets, or other written materials from your doctor or pharmacy? 1 - Never    What is the last grade level you completed in school? some college      Pre-Education Assessment   Patient understands the diabetes disease and treatment process. Needs Instruction    Patient understands incorporating nutritional management into lifestyle. Needs Instruction    Patient undertands incorporating physical activity into lifestyle. Needs Instruction    Patient understands using medications safely. Needs Instruction    Patient understands monitoring blood glucose, interpreting and using results Needs Review    Patient understands prevention, detection, and treatment  of acute complications. Needs Instruction    Patient understands prevention, detection, and treatment of chronic complications. Needs Instruction    Patient understands how to develop strategies to address psychosocial issues. Needs Instruction    Patient understands how to develop strategies to promote health/change behavior. Needs Instruction      Complications   Last HgB A1C per patient/outside source 5.3 %   03/07/2020   How often do you check your blood sugar? 3-4 times/day    Fasting Blood glucose range (mg/dL) 70-129   Pt reports FBG's 85-90 mg/dL.   Postprandial Blood glucose range (mg/dL) 70-129   Pt reports pp's 80-108 mg/dL. She had 1 reading of 150 mg/dL after having 2 packets of oatmeal.   Have you had a dilated eye exam in the past 12 months? No    Have you had a dental exam in the past 12 months? Yes    Are you checking your feet? Yes    How many days per week are you checking your feet? 7      Dietary Intake   Breakfast sometimes skips - may just drink coffee; sausage burrito from [employer]; oatmeal    Snack (morning) 1-2 snacks/day - crackers with peanut butter or cheddar and chives; fruit cups, banana, apple, sweets    Lunch tuna salads; [employer] grilled chicken salad    Dinner grilled chicken, salmon; beans, green beans, rice, broccoli, pasta, lettuce, tomatoes, cukes    Beverage(s) water. 1 cup of coffee with creamer      Exercise  Exercise Type ADL's      Patient Education   Previous Diabetes Education No    Disease state  Definition of diabetes, type 1 and 2, and the diagnosis of diabetes;Factors that contribute to the development of diabetes    Nutrition management  Role of diet in the treatment of diabetes and the relationship between the three main macronutrients and blood glucose level;Food label reading, portion sizes and measuring food.;Reviewed blood glucose goals for pre and post meals and how to evaluate the patients' food intake on their blood glucose  level.;Other (comment)   Low salt food options   Physical activity and exercise  Role of exercise on diabetes management, blood pressure control and cardiac health.    Medications Other (comment)   Limited use of oral medications during pregnancy and potential for insulin.   Monitoring Purpose and frequency of SMBG.;Taught/discussed recording of test results and interpretation of SMBG.;Identified appropriate SMBG and/or A1C goals.;Ketone testing, when, how.    Chronic complications Relationship between chronic complications and blood glucose control    Psychosocial adjustment Identified and addressed patients feelings and concerns about diabetes    Preconception care Pregnancy and GDM  Role of pre-pregnancy blood glucose control on the development of the fetus;Reviewed with patient blood glucose goals with pregnancy;Role of family planning for patients with diabetes      Individualized Goals (developed by patient)   Reducing Risk Other (comment)   improve blood sugars, lose weight, lead a healthier life     Outcomes   Expected Outcomes Demonstrated interest in learning. Expect positive outcomes    Future DMSE 2 wks           Individualized Plan for Diabetes Self-Management Training:   Learning Objective:  Patient will have a greater understanding of diabetes self-management. Patient education plan is to attend individual and/or group sessions per assessed needs and concerns.   Plan:   Patient Instructions  Read booklet on Gestational Diabetes Follow Gestational Meal Planning Guidelines Don't skip meals (eat at least 1 serving of protein and 1 serving of carbohydrate) Limit desserts/sweets Complete a 3 Day Food Record and bring to next appointment Check blood sugars 4 x day - before breakfast and 2 hrs after every meal and record  Bring blood sugar log to all appointments Purchase urine ketone strips if instructed by MD and check urine ketones every am:  If + increase bedtime snack  to 1 protein and 2 carbohydrate servings Walk 20-30 minutes at least 5 x week if permitted by MD  Expected Outcomes:  Demonstrated interest in learning. Expect positive outcomes  Education material provided:  Gestational Booklet Gestational Meal Planning Guidelines Simple Meal Plan Viewed Gestational Diabetes Video 3 Day Food Record Goals for a Healthy Pregnancy  If problems or questions, patient to contact team via:  Johny Drilling, RN, [place] [phone]  Future DSME appointment: 2 wks  July 31, 2020 with the dietitian

## 2020-07-24 ENCOUNTER — Other Ambulatory Visit: Payer: Self-pay

## 2020-07-24 ENCOUNTER — Ambulatory Visit (INDEPENDENT_AMBULATORY_CARE_PROVIDER_SITE_OTHER): Payer: BC Managed Care – PPO | Admitting: Family Medicine

## 2020-07-24 VITALS — BP 140/92 | HR 69 | Wt 232.2 lb

## 2020-07-24 DIAGNOSIS — O119 Pre-existing hypertension with pre-eclampsia, unspecified trimester: Secondary | ICD-10-CM

## 2020-07-24 DIAGNOSIS — Z23 Encounter for immunization: Secondary | ICD-10-CM

## 2020-07-24 DIAGNOSIS — O09529 Supervision of elderly multigravida, unspecified trimester: Secondary | ICD-10-CM

## 2020-07-24 DIAGNOSIS — O0990 Supervision of high risk pregnancy, unspecified, unspecified trimester: Secondary | ICD-10-CM

## 2020-07-24 DIAGNOSIS — O99210 Obesity complicating pregnancy, unspecified trimester: Secondary | ICD-10-CM

## 2020-07-24 DIAGNOSIS — O24410 Gestational diabetes mellitus in pregnancy, diet controlled: Secondary | ICD-10-CM

## 2020-07-24 DIAGNOSIS — O10019 Pre-existing essential hypertension complicating pregnancy, unspecified trimester: Secondary | ICD-10-CM

## 2020-07-24 DIAGNOSIS — O10919 Unspecified pre-existing hypertension complicating pregnancy, unspecified trimester: Secondary | ICD-10-CM | POA: Insufficient documentation

## 2020-07-24 HISTORY — DX: Pre-existing hypertension with pre-eclampsia, unspecified trimester: O11.9

## 2020-07-24 NOTE — Patient Instructions (Signed)

## 2020-07-24 NOTE — Progress Notes (Signed)
   PRENATAL VISIT NOTE  Subjective:  Brandy Mack is a 34 y.o. [phone] at [redacted]w[redacted]d being seen today for ongoing prenatal care.  She is currently monitored for the following issues for this high-risk pregnancy and has History of gestational hypertension; Chronic hypertension; Supervision of high risk pregnancy, antepartum; Maternal morbid obesity, antepartum ([place]); Former tobacco use; AMA (advanced maternal age) multigravida 54+, unspecified trimester; BMI 40.0-44.9, adult ([place]); Gestational diabetes mellitus in third trimester; and Hypertension in pregnancy, antepartum on their problem list.  Patient reports no complaints.  Contractions: Irritability. Vag. Bleeding: None.  Movement: Present. Denies leaking of fluid.   The following portions of the patient's history were reviewed and updated as appropriate: allergies, current medications, past family history, past medical history, past social history, past surgical history and problem list.   Objective:   Vitals:   07/24/20 1522  BP: (!) 140/92  Pulse: 69  Weight: 232 lb 3.2 oz (105.3 kg)    Fetal Status: Fetal Heart Rate (bpm): 131 Fundal Height: 28 cm Movement: Present     General:  Alert, oriented and cooperative. Patient is in no acute distress.  Skin: Skin is warm and dry. No rash noted.   Cardiovascular: Normal heart rate noted  Respiratory: Normal respiratory effort, no problems with respiration noted  Abdomen: Soft, gravid, appropriate for gestational age.  Pain/Pressure: Present     Pelvic: Cervical exam deferred        Extremities: Normal range of motion.  Edema: Mild pitting, slight indentation  Mental Status: Normal mood and affect. Normal behavior. Normal judgment and thought content.   Assessment and Plan:  Pregnancy: [id number] at [redacted]w[redacted]d 1. Diet controlled gestational diabetes mellitus (GDM) in third trimester Has some CBGs, prior to going to Nutrition. Since then, fastings are 85-90 and 2 hour pp are 112-120 ish To get  babyscripts app  2. Supervision of high risk pregnancy, antepartum Continue prenatal care. Having round ligament pain and hip pain. Few BH contractions.  3. Maternal morbid obesity, antepartum ([facility]) TWG 1 lb  4. Pre-existing essential hypertension during pregnancy, antepartum BP is ok on Labetalol ASA  5. AMA (advanced maternal age) multigravida 31+, unspecified trimester Normal NIPT  Preterm labor symptoms and general obstetric precautions including but not limited to vaginal bleeding, contractions, leaking of fluid and fetal movement were reviewed in detail with the patient. Please refer to After Visit Summary for other counseling recommendations.   Return in 2 weeks (on 08/07/2020).  Future Appointments  Date Time Provider [place]  07/31/2020  1:15 PM Jethro Bastos D, RD [facility] None  07/31/2020  3:30 PM [facility] NURSE [facility]  07/31/2020  3:45 PM [facility] US4 [facility]  08/07/2020  1:30 PM [facility]URSE [facility]  08/07/2020  1:45 PM [facility] US4 [facility]  08/07/2020  3:30 PM Aletha Halim, MD [facility]  08/15/2020  1:30 PM [facility] NURSE [facility]  08/15/2020  1:45 PM [facility] US4 [facility]  08/21/2020  1:30 PM [facility] NURSE [facility]  08/21/2020  1:45 PM [facility] US4 [facility]  08/21/2020  3:30 PM Anyanwu, Sallyanne Havers, MD [facility]  09/04/2020  3:30 PM Donnamae Jude, MD [facility]    Donnamae Jude, MD

## 2020-07-31 ENCOUNTER — Ambulatory Visit: Payer: BC Managed Care – PPO | Admitting: *Deleted

## 2020-07-31 ENCOUNTER — Encounter: Payer: BC Managed Care – PPO | Attending: Obstetrics & Gynecology | Admitting: Dietician

## 2020-07-31 ENCOUNTER — Other Ambulatory Visit: Payer: Self-pay

## 2020-07-31 ENCOUNTER — Encounter: Payer: Self-pay | Admitting: *Deleted

## 2020-07-31 ENCOUNTER — Encounter: Payer: Self-pay | Admitting: Dietician

## 2020-07-31 ENCOUNTER — Ambulatory Visit: Payer: BC Managed Care – PPO | Attending: Obstetrics and Gynecology

## 2020-07-31 VITALS — Ht 63.0 in | Wt 234.1 lb

## 2020-07-31 DIAGNOSIS — O10913 Unspecified pre-existing hypertension complicating pregnancy, third trimester: Secondary | ICD-10-CM | POA: Diagnosis not present

## 2020-07-31 DIAGNOSIS — O24410 Gestational diabetes mellitus in pregnancy, diet controlled: Secondary | ICD-10-CM

## 2020-07-31 DIAGNOSIS — E669 Obesity, unspecified: Secondary | ICD-10-CM

## 2020-07-31 DIAGNOSIS — O09523 Supervision of elderly multigravida, third trimester: Secondary | ICD-10-CM | POA: Diagnosis not present

## 2020-07-31 DIAGNOSIS — O10919 Unspecified pre-existing hypertension complicating pregnancy, unspecified trimester: Secondary | ICD-10-CM | POA: Insufficient documentation

## 2020-07-31 DIAGNOSIS — O99213 Obesity complicating pregnancy, third trimester: Secondary | ICD-10-CM | POA: Diagnosis not present

## 2020-07-31 DIAGNOSIS — Z3A31 31 weeks gestation of pregnancy: Secondary | ICD-10-CM

## 2020-07-31 DIAGNOSIS — O10019 Pre-existing essential hypertension complicating pregnancy, unspecified trimester: Secondary | ICD-10-CM | POA: Diagnosis present

## 2020-07-31 DIAGNOSIS — D259 Leiomyoma of uterus, unspecified: Secondary | ICD-10-CM

## 2020-07-31 DIAGNOSIS — Z362 Encounter for other antenatal screening follow-up: Secondary | ICD-10-CM

## 2020-07-31 DIAGNOSIS — O3413 Maternal care for benign tumor of corpus uteri, third trimester: Secondary | ICD-10-CM

## 2020-07-31 NOTE — Progress Notes (Signed)
.   Patient's BG record indicates fasting BGs ranging 85-90 , and post-meal BGs ranging 85-125 . Patient's food diary indicates pt has made several adjustments to diet includes better portion control of carbs, less fried foods, less added sugar in diet, including protein source at most meals and snacks   . Provided individualized menus based on patient's food preferences. . Instructed patient on food safety, including avoidance of Listeriosis, and limiting mercury from fish. . Discussed importance of maintaining healthy lifestyle habits to reduce risk of Type 2 DM as well as Gestational DM with any future pregnancies. . Advised patient to use any remaining testing supplies to test some BGs after delivery, and to have BG tested ideally annually, as well as prior to attempting future pregnancies.

## 2020-08-03 ENCOUNTER — Ambulatory Visit (INDEPENDENT_AMBULATORY_CARE_PROVIDER_SITE_OTHER): Payer: BC Managed Care – PPO | Admitting: *Deleted

## 2020-08-03 ENCOUNTER — Other Ambulatory Visit: Payer: Self-pay

## 2020-08-03 ENCOUNTER — Encounter: Payer: Self-pay | Admitting: *Deleted

## 2020-08-03 VITALS — BP 137/91 | HR 84

## 2020-08-03 DIAGNOSIS — O10019 Pre-existing essential hypertension complicating pregnancy, unspecified trimester: Secondary | ICD-10-CM

## 2020-08-03 DIAGNOSIS — Z3A31 31 weeks gestation of pregnancy: Secondary | ICD-10-CM

## 2020-08-03 DIAGNOSIS — O134 Gestational [pregnancy-induced] hypertension without significant proteinuria, complicating childbirth: Secondary | ICD-10-CM

## 2020-08-03 NOTE — Progress Notes (Signed)
Patient was assessed and managed by nursing staff during this encounter. I have reviewed the chart and agree with the documentation and plan. I have also made any necessary editorial changes.  Verita Schneiders, MD 08/03/2020 9:57 AM

## 2020-08-03 NOTE — Progress Notes (Signed)
Pt called this AM, babyscripts had called her last night due to her two BP's she logged in. They were 130/86 and 140/89. She was advised by babyscripts to go to L& D, but patient decided to wait and call us this AM to make sure. Pt denies any HA, RUQ pain, or any other symptoms. Pt states she feels fine. Had patient come by office to check her BP.   BP in office was 137/91. Dr A made aware, no change to POC at this time.   Pt to follow up at her next [place] visit on 10/12.

## 2020-08-07 ENCOUNTER — Ambulatory Visit: Payer: BC Managed Care – PPO | Attending: Obstetrics and Gynecology

## 2020-08-07 ENCOUNTER — Telehealth (INDEPENDENT_AMBULATORY_CARE_PROVIDER_SITE_OTHER): Payer: BC Managed Care – PPO | Admitting: Obstetrics and Gynecology

## 2020-08-07 ENCOUNTER — Other Ambulatory Visit: Payer: Self-pay

## 2020-08-07 ENCOUNTER — Ambulatory Visit: Payer: BC Managed Care – PPO | Admitting: *Deleted

## 2020-08-07 ENCOUNTER — Encounter: Payer: Self-pay | Admitting: *Deleted

## 2020-08-07 VITALS — BP 147/93

## 2020-08-07 VITALS — BP 151/81 | HR 68

## 2020-08-07 DIAGNOSIS — O10913 Unspecified pre-existing hypertension complicating pregnancy, third trimester: Secondary | ICD-10-CM

## 2020-08-07 DIAGNOSIS — O99213 Obesity complicating pregnancy, third trimester: Secondary | ICD-10-CM

## 2020-08-07 DIAGNOSIS — I10 Essential (primary) hypertension: Secondary | ICD-10-CM

## 2020-08-07 DIAGNOSIS — O10919 Unspecified pre-existing hypertension complicating pregnancy, unspecified trimester: Secondary | ICD-10-CM | POA: Insufficient documentation

## 2020-08-07 DIAGNOSIS — O24419 Gestational diabetes mellitus in pregnancy, unspecified control: Secondary | ICD-10-CM

## 2020-08-07 DIAGNOSIS — O99210 Obesity complicating pregnancy, unspecified trimester: Secondary | ICD-10-CM

## 2020-08-07 DIAGNOSIS — E669 Obesity, unspecified: Secondary | ICD-10-CM

## 2020-08-07 DIAGNOSIS — D259 Leiomyoma of uterus, unspecified: Secondary | ICD-10-CM

## 2020-08-07 DIAGNOSIS — O403XX0 Polyhydramnios, third trimester, not applicable or unspecified: Secondary | ICD-10-CM

## 2020-08-07 DIAGNOSIS — O09523 Supervision of elderly multigravida, third trimester: Secondary | ICD-10-CM | POA: Diagnosis not present

## 2020-08-07 DIAGNOSIS — Z6841 Body Mass Index (BMI) 40.0 and over, adult: Secondary | ICD-10-CM

## 2020-08-07 DIAGNOSIS — Z3A32 32 weeks gestation of pregnancy: Secondary | ICD-10-CM

## 2020-08-07 DIAGNOSIS — O0990 Supervision of high risk pregnancy, unspecified, unspecified trimester: Secondary | ICD-10-CM

## 2020-08-07 DIAGNOSIS — O24410 Gestational diabetes mellitus in pregnancy, diet controlled: Secondary | ICD-10-CM

## 2020-08-07 DIAGNOSIS — O3413 Maternal care for benign tumor of corpus uteri, third trimester: Secondary | ICD-10-CM | POA: Diagnosis not present

## 2020-08-07 DIAGNOSIS — O09529 Supervision of elderly multigravida, unspecified trimester: Secondary | ICD-10-CM

## 2020-08-07 NOTE — Progress Notes (Signed)
TELEHEALTH VIRTUAL OBSTETRICS VISIT ENCOUNTER NOTE  Clinic: [facility]  I connected with Brandy Mack on 08/07/20 at  3:30 PM EDT by telephone at home and verified that I am speaking with the correct person using two identifiers.   I discussed the limitations, risks, security and privacy concerns of performing an evaluation and management service by telephone and the availability of in person appointments. I also discussed with the patient that there may be a patient responsible charge related to this service. The patient expressed understanding and agreed to proceed.  Subjective:  Brandy Mack is a 34 y.o. [phone] at [redacted]w[redacted]d being followed for ongoing prenatal care.  She is currently monitored for the following issues for this high-risk pregnancy and has History of gestational hypertension; Supervision of high risk pregnancy, antepartum; Maternal morbid obesity, antepartum ([place]); Former tobacco use; AMA (advanced maternal age) multigravida 68+, unspecified trimester; BMI 40.0-44.9, adult ([place]); Gestational diabetes mellitus in third trimester; Chronic hypertension affecting pregnancy; and Polyhydramnios in third trimester on their problem list.  Patient reports no complaints. Reports fetal movement. Denies any contractions, bleeding or leaking of fluid.   The following portions of the patient's history were reviewed and updated as appropriate: allergies, current medications, past family history, past medical history, past social history, past surgical history and problem list.   Objective:   Vitals:   08/07/20 1516  BP: (!) 147/93    Babyscripts Data Reviewed: yes  General:  Alert, oriented and cooperative.   Mental Status: Normal mood and affect perceived. Normal judgment and thought content.  Rest of physical exam deferred due to type of encounter  Assessment and Plan:  Pregnancy: [id number] at [redacted]w[redacted]d 1. Chronic hypertension affecting pregnancy On labetalol  200 bid currently. Will increase to 300 bid. bpp 8/8 with slight poly of afi 25 today. Continue with weekly testing. I d/w her that will pin down a delivery date once closer to term but likely 38-39wks  I d/w her to let us know if consistently over 155/105 for her BPs. I told her ROS for which to go to L&D for.   2. Gestational diabetes mellitus (GDM) in third trimester, gestational diabetes method of control unspecified Doing well on no meds. CBGs reviewed on babyscripts  3. Supervision of high risk pregnancy, antepartum Routine care.   4. BMI 40.0-44.9, adult ([place])  5. AMA (advanced maternal age) multigravida 51+, unspecified trimester  6. Maternal morbid obesity, antepartum ([place])  7. Polyhydramnios in third trimester complication, single or unspecified fetus See above  Preterm labor symptoms and general obstetric precautions including but not limited to vaginal bleeding, contractions, leaking of fluid and fetal movement were reviewed in detail with the patient.  I discussed the assessment and treatment plan with the patient. The patient was provided an opportunity to ask questions and all were answered. The patient agreed with the plan and demonstrated an understanding of the instructions. The patient was advised to call back or seek an in-person office evaluation/go to [facility] at [facility] for any urgent or concerning symptoms. Please refer to After Visit Summary for other counseling recommendations.   I provided 10 minutes of non-face-to-face time during this encounter. The visit was conducted via MyChart-medicine  Return in about 2 weeks (around 08/21/2020) for high risk, virtual visit.  Future Appointments  Date Time Provider [place]  08/15/2020  1:30 PM [facility] NURSE [facility]  08/15/2020  1:45 PM [facility] US4 [facility]  08/21/2020  1:30 PM [facility]  NURSE [facility]  08/21/2020  1:45 PM [facility] US4 [facility]  08/21/2020  3:30 PM Anyanwu,  Sallyanne Havers, MD [facility]  09/04/2020  3:30 PM Donnamae Jude, MD [facility]    Aletha Halim, [place] [facility]

## 2020-08-15 ENCOUNTER — Ambulatory Visit: Payer: BC Managed Care – PPO | Admitting: *Deleted

## 2020-08-15 ENCOUNTER — Other Ambulatory Visit: Payer: Self-pay

## 2020-08-15 ENCOUNTER — Encounter: Payer: Self-pay | Admitting: *Deleted

## 2020-08-15 ENCOUNTER — Ambulatory Visit: Payer: BC Managed Care – PPO | Attending: Obstetrics and Gynecology

## 2020-08-15 ENCOUNTER — Other Ambulatory Visit: Payer: Self-pay | Admitting: *Deleted

## 2020-08-15 DIAGNOSIS — O10919 Unspecified pre-existing hypertension complicating pregnancy, unspecified trimester: Secondary | ICD-10-CM | POA: Diagnosis not present

## 2020-08-15 DIAGNOSIS — O99213 Obesity complicating pregnancy, third trimester: Secondary | ICD-10-CM

## 2020-08-15 DIAGNOSIS — O10913 Unspecified pre-existing hypertension complicating pregnancy, third trimester: Secondary | ICD-10-CM

## 2020-08-15 DIAGNOSIS — O24410 Gestational diabetes mellitus in pregnancy, diet controlled: Secondary | ICD-10-CM

## 2020-08-15 DIAGNOSIS — Z3A33 33 weeks gestation of pregnancy: Secondary | ICD-10-CM

## 2020-08-15 DIAGNOSIS — O3413 Maternal care for benign tumor of corpus uteri, third trimester: Secondary | ICD-10-CM

## 2020-08-15 DIAGNOSIS — D259 Leiomyoma of uterus, unspecified: Secondary | ICD-10-CM

## 2020-08-15 DIAGNOSIS — O09523 Supervision of elderly multigravida, third trimester: Secondary | ICD-10-CM | POA: Diagnosis not present

## 2020-08-15 DIAGNOSIS — E669 Obesity, unspecified: Secondary | ICD-10-CM

## 2020-08-21 ENCOUNTER — Ambulatory Visit: Payer: BC Managed Care – PPO | Attending: Obstetrics and Gynecology

## 2020-08-21 ENCOUNTER — Telehealth (INDEPENDENT_AMBULATORY_CARE_PROVIDER_SITE_OTHER): Payer: BC Managed Care – PPO | Admitting: Obstetrics & Gynecology

## 2020-08-21 ENCOUNTER — Encounter: Payer: Self-pay | Admitting: *Deleted

## 2020-08-21 ENCOUNTER — Other Ambulatory Visit: Payer: Self-pay

## 2020-08-21 ENCOUNTER — Ambulatory Visit: Payer: BC Managed Care – PPO | Admitting: *Deleted

## 2020-08-21 DIAGNOSIS — O10919 Unspecified pre-existing hypertension complicating pregnancy, unspecified trimester: Secondary | ICD-10-CM | POA: Insufficient documentation

## 2020-08-21 DIAGNOSIS — O10913 Unspecified pre-existing hypertension complicating pregnancy, third trimester: Secondary | ICD-10-CM

## 2020-08-21 DIAGNOSIS — O09523 Supervision of elderly multigravida, third trimester: Secondary | ICD-10-CM

## 2020-08-21 DIAGNOSIS — Z3A34 34 weeks gestation of pregnancy: Secondary | ICD-10-CM

## 2020-08-21 DIAGNOSIS — O3413 Maternal care for benign tumor of corpus uteri, third trimester: Secondary | ICD-10-CM | POA: Diagnosis not present

## 2020-08-21 DIAGNOSIS — O0990 Supervision of high risk pregnancy, unspecified, unspecified trimester: Secondary | ICD-10-CM

## 2020-08-21 DIAGNOSIS — E669 Obesity, unspecified: Secondary | ICD-10-CM

## 2020-08-21 DIAGNOSIS — O403XX0 Polyhydramnios, third trimester, not applicable or unspecified: Secondary | ICD-10-CM

## 2020-08-21 DIAGNOSIS — O24410 Gestational diabetes mellitus in pregnancy, diet controlled: Secondary | ICD-10-CM

## 2020-08-21 DIAGNOSIS — O99213 Obesity complicating pregnancy, third trimester: Secondary | ICD-10-CM | POA: Diagnosis not present

## 2020-08-21 DIAGNOSIS — D259 Leiomyoma of uterus, unspecified: Secondary | ICD-10-CM

## 2020-08-21 DIAGNOSIS — Z362 Encounter for other antenatal screening follow-up: Secondary | ICD-10-CM

## 2020-08-21 DIAGNOSIS — O0993 Supervision of high risk pregnancy, unspecified, third trimester: Secondary | ICD-10-CM

## 2020-08-21 DIAGNOSIS — Z87891 Personal history of nicotine dependence: Secondary | ICD-10-CM

## 2020-08-21 IMAGING — US US MFM FETAL BPP W/O NON-STRESS
1 series · 14 of 28 positions shown · non-contrast
Comparison: none

[Series 1: us mfm fetal bpp w/o non-stress · 32 acquisitions, 14 frames shown]
[im 2/32]
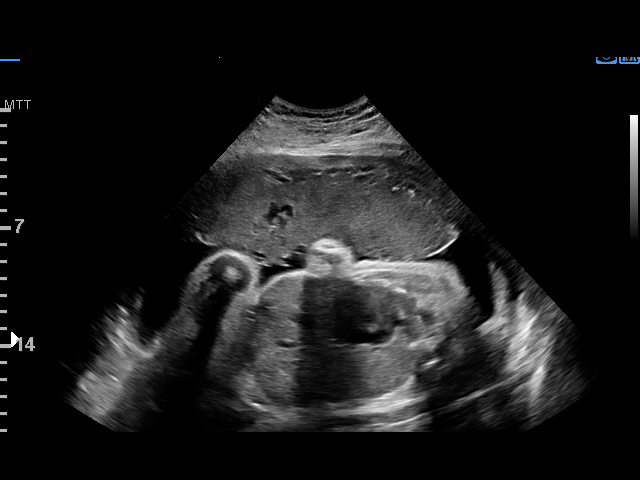
[im 4/32]
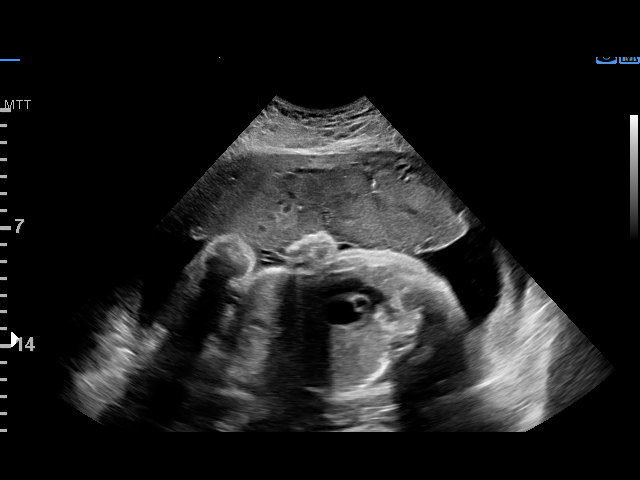
[im 6/32]
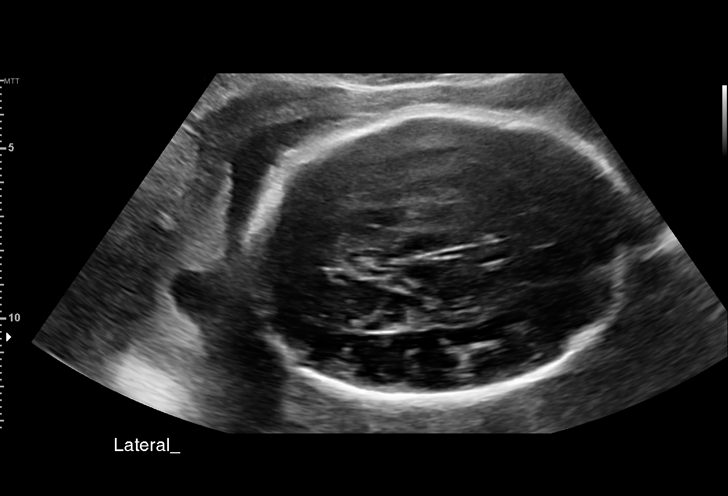
[im 9/32]
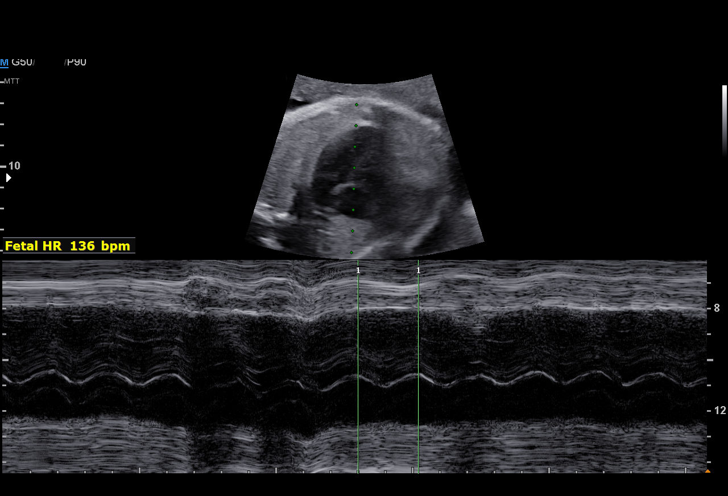
[im 11/32]
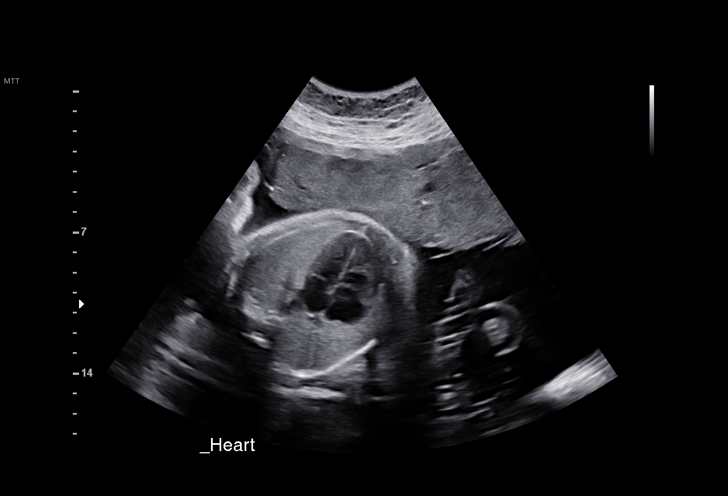
[im 13/32]
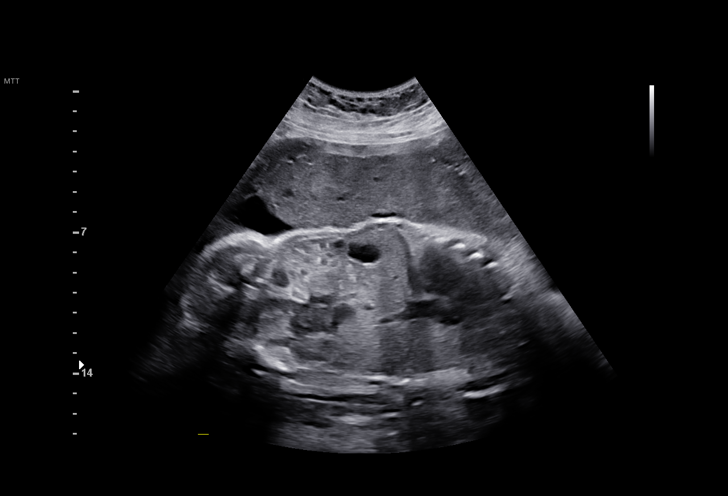
[im 15/32]
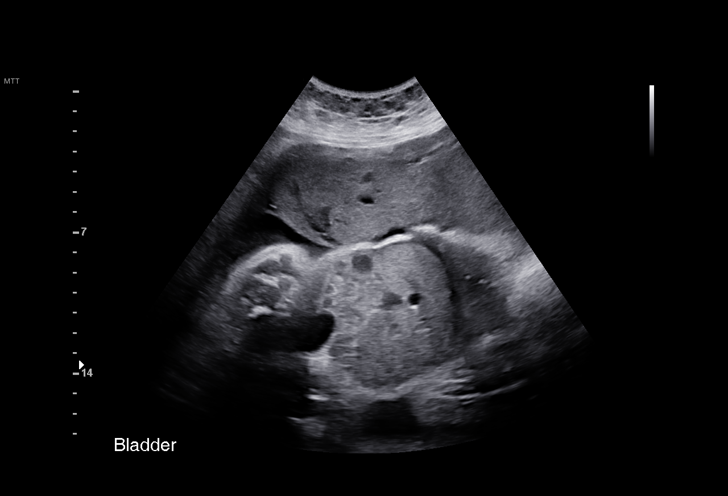
[im 18/32]
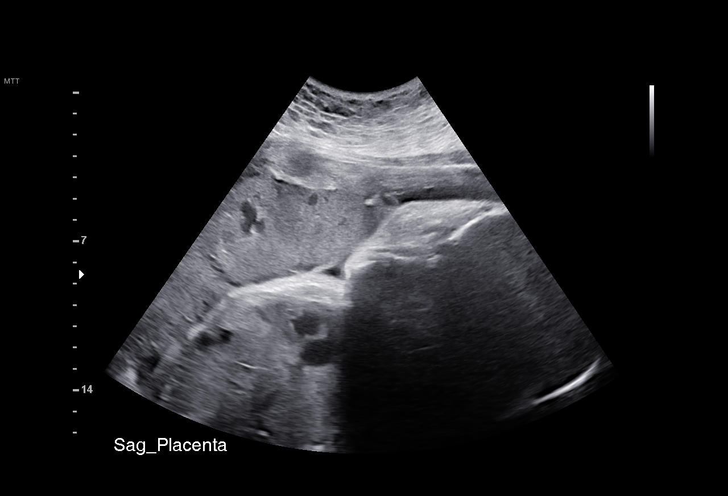
[im 20/32]
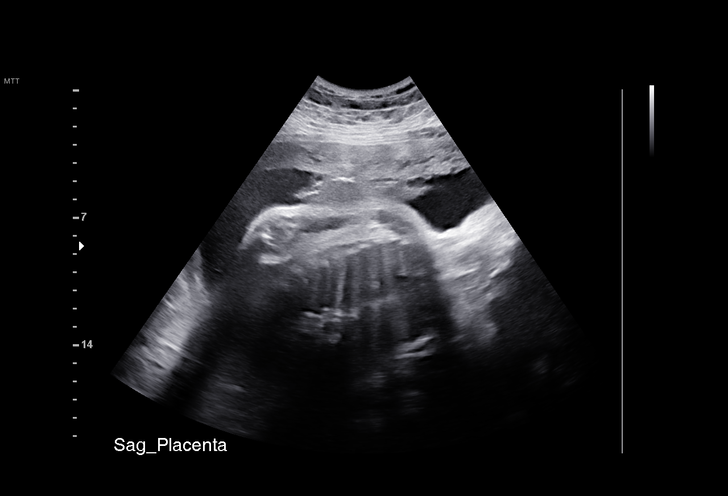
[im 22/32]
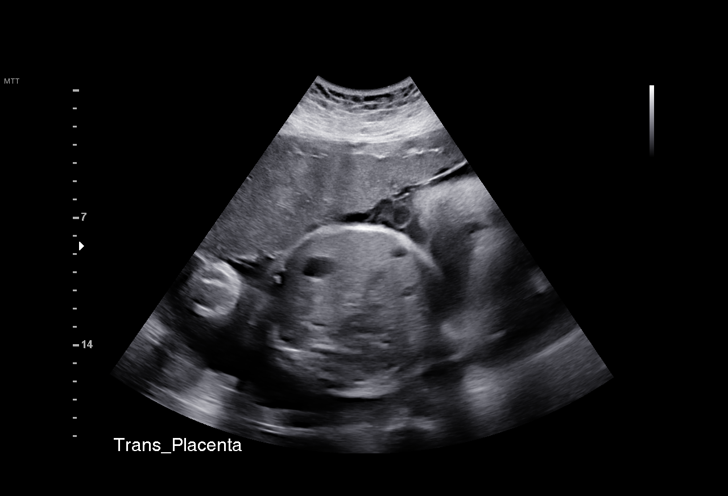
[im 25/32]
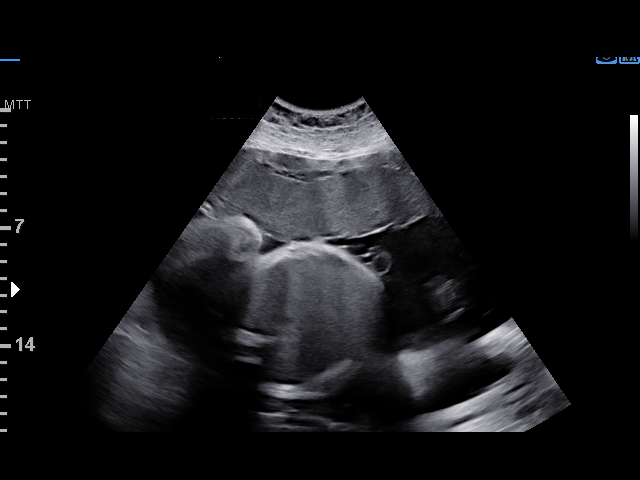
[im 27/32]
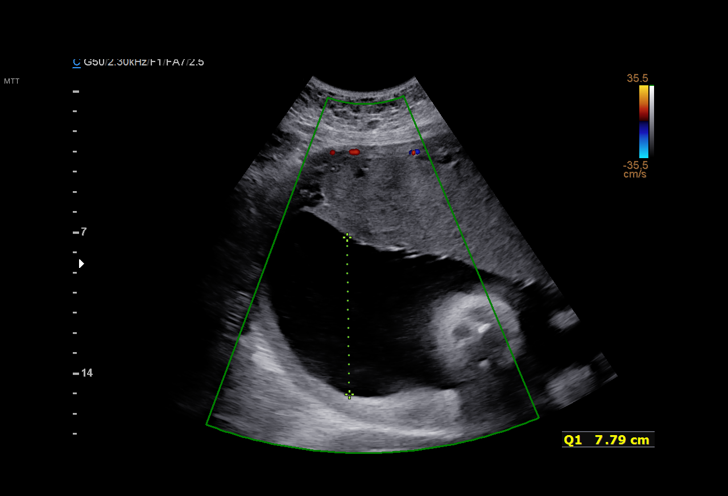
[im 29/32]
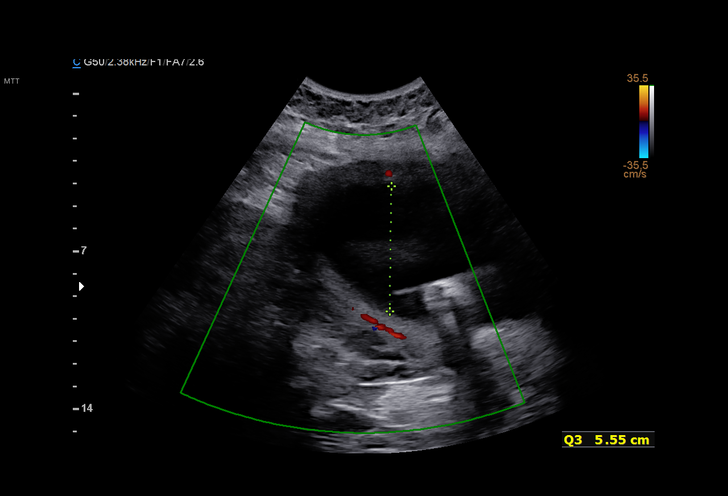
[im 32/32]
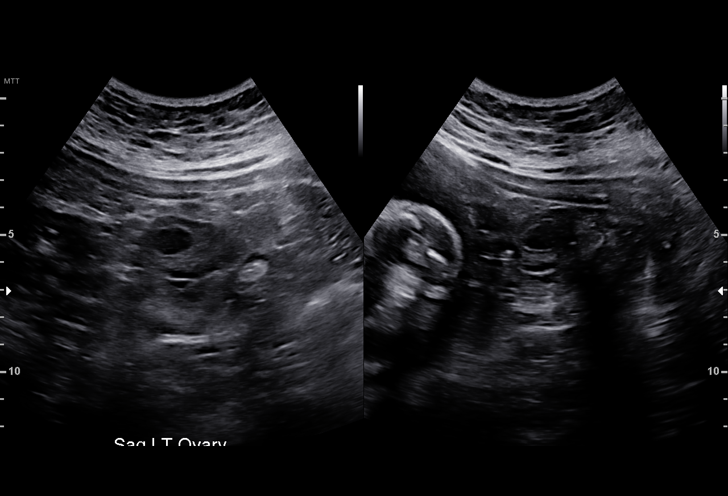

[14 of 28 positions shown; findings below may reference images not displayed]

Indications

 34 weeks gestation of pregnancy
 Hypertension - Chronic/Pre-existing            [H9]
 (labetalol)
 Advanced maternal age multigravida 35+,        [H9]
 third trimester (low risk NIPS)
 Obesity complicating pregnancy, third          [H9]
 trimester (pregravid BMI 42)
 Uterine fibroids affecting pregnancy in third  O34.13, [H9]
 trimester, antepartum
 Gestational diabetes in pregnancy, diet        [H9]
 controlled
 Encounter for other antenatal screening        [H9]
 follow-up
Fetal Evaluation

 Num Of Fetuses:         1
 Fetal Heart Rate(bpm):  136
 Cardiac Activity:       Observed
 Presentation:           Cephalic
 Placenta:               Anterior
 P. Cord Insertion:      Previously Visualized

 Amniotic Fluid
 AFI FV:      Polyhydramnios

 AFI Sum(cm)     %Tile       Largest Pocket(cm)
 27.8            97          8

 RUQ(cm)       RLQ(cm)       LUQ(cm)        LLQ(cm)
 7.8           6.5           8
Biophysical Evaluation

 Amniotic F.V:   Pocket => 2 cm             F. Tone:        Observed
 F. Movement:    Observed                   Score:          [DATE]
 F. Breathing:   Observed
Biometry

 LV:        3.2  mm
OB History

 Gravidity:    3         Term:   1        Prem:   0        SAB:   1
 TOP:          0       Ectopic:  0        Living: 1
Gestational Age

 LMP:           34w 2d        Date:  [DATE]                 EDD:   [DATE]
 Best:          34w 2d     Det. By:  LMP  ([DATE])          EDD:   [DATE]
Anatomy

 Ventricles:            Appears normal         Stomach:                Appears normal, left
                                                                       sided
 Heart:                 Appears normal         Kidneys:                Appear normal
                        (4CH, axis, and
                        situs)
 Diaphragm:             Appears normal         Bladder:                Appears normal

 Other:  Technically difficult due to maternal habitus, advanced gestational
         age, and fetal position.
Cervix Uterus Adnexa

 Cervix
 Not visualized (advanced GA >[H9])

 Uterus
 No abnormality visualized.

 Right Ovary
 Within normal limits. No adnexal mass visualized.

 Left Ovary
 Within normal limits. No adnexal mass visualized.

 Cul De Sac
 No free fluid seen.

 Adnexa
 No abnormality visualized.
Impression

 Chronic hypertension.  Patient takes labetalol 300 mg twice
 daily the dosage was recently increased.  Blood pressures
 today at our office were 147/86 and 153/83 mmHg.  Patient
 does not have symptoms of severe features of preeclampsia.
 She also has gestational diabetes that is well controlled on
 diet.
 Mild polyhydramnios is seen.  Good fetal activity is
 present.Antenatal testing is reassuring. BPP [DATE].

 I reassured the patient of ultrasound findings.  I discussed
 blood pressure parameters and encouraged her to bring her
 blood pressure cuff to our office to verify accuracy.
Recommendations

 -Continue weekly BPP till delivery.
                 WEINER

## 2020-08-21 MED ORDER — LABETALOL HCL 200 MG PO TABS: 400.0000 mg | ORAL_TABLET | Freq: Two times a day (BID) | ORAL | 5 refills | Status: DC

## 2020-08-21 NOTE — Progress Notes (Signed)
OBSTETRICS PRENATAL VIRTUAL VISIT ENCOUNTER NOTE  Provider location: [facility] [place] [facility]   I connected with Brandy Mack on 08/21/20 at  3:30 PM EDT by MyChart Video Encounter at home and verified that I am speaking with the correct person using two identifiers.   I discussed the limitations, risks, security and privacy concerns of performing an evaluation and management service virtually and the availability of in person appointments. I also discussed with the patient that there may be a patient responsible charge related to this service. The patient expressed understanding and agreed to proceed. Subjective:  Brandy Mack is a 34 y.o. [phone] at [redacted]w[redacted]d being seen today for ongoing prenatal care.  She is currently monitored for the following issues for this high-risk pregnancy and has History of gestational hypertension; Supervision of high risk pregnancy, antepartum; Maternal morbid obesity, antepartum ([place]); Former tobacco use; AMA (advanced maternal age) multigravida 77+, unspecified trimester; BMI 40.0-44.9, adult ([place]); Gestational diabetes mellitus in third trimester; Chronic hypertension affecting pregnancy; and Polyhydramnios in third trimester on their problem list.  Patient reports no complaints.  Contractions: Not present. Vag. Bleeding: None.  Movement: Present. Denies any leaking of fluid.   The following portions of the patient's history were reviewed and updated as appropriate: allergies, current medications, past family history, past medical history, past social history, past surgical history and problem list.   Objective:   BP 147/86, 153/83 at MFM visit today. Fetal Status:     Movement: Present     General:  Alert, oriented and cooperative. Patient is in no acute distress.  Respiratory: Normal respiratory effort, no problems with respiration noted  Mental Status: Normal mood and affect. Normal behavior. Normal judgment and thought content.    Rest of physical exam deferred due to type of encounter  Imaging: [place] MFM FETAL BPP WO NON STRESS  Result Date: 08/21/2020 ----------------------------------------------------------------------  OBSTETRICS REPORT                       (Signed Final 08/21/2020 02:40 pm) ---------------------------------------------------------------------- Patient Info  ID #:       [id number]                          D.O.B.:  30-Dec-1985 (34 yrs)  Name:       Brandy Mack                  Visit Date: 08/21/2020 02:24 pm ---------------------------------------------------------------------- Performed By  Attending:        Tama High MD        Ref. Address:     [place]  Performed By:     Jacob Moores BS,       [facility], [facility]  [facility] at                                                             [place] [facility]                                                             [facility]  Referred By:      [facility] ---------------------------------------------------------------------- Orders  #  Description                           Code        Ordered By  1  [place] MFM FETAL BPP WO NON               76819.01    RAVI [facility]     STRESS ----------------------------------------------------------------------  #  Order #                     Accession #                Episode #  1  [id number]                   [id number]                 [id number] ---------------------------------------------------------------------- Indications  [redacted] weeks gestation of pregnancy                Z3A.34  Hypertension - Chronic/Pre-existing            O10.019  (labetalol)  Advanced maternal age multigravida 56+,        O11.523  third trimester (low risk NIPS)  Obesity complicating pregnancy, third          O99.213  trimester (pregravid BMI 42)  Uterine fibroids affecting pregnancy in third  O34.13, D25.9   trimester, antepartum  Gestational diabetes in pregnancy, diet        O24.410  controlled  Encounter for other antenatal screening        Z36.2  follow-up ---------------------------------------------------------------------- Fetal Evaluation  Num Of Fetuses:         1  Fetal Heart Rate(bpm):  136  Cardiac Activity:       Observed  Presentation:           Cephalic  Placenta:               Anterior  P. Cord Insertion:      Previously Visualized  Amniotic Fluid  AFI FV:      Polyhydramnios  AFI Sum(cm)     %Tile       Largest Pocket(cm)  27.8            97          8  RUQ(cm)       RLQ(cm)       LUQ(cm)        LLQ(cm)  7.8           6.5           8  5.6 ---------------------------------------------------------------------- Biophysical Evaluation  Amniotic F.V:   Pocket => 2 cm             F. Tone:        Observed  F. Movement:    Observed                   Score:          8/8  F. Breathing:   Observed ---------------------------------------------------------------------- Biometry  LV:        3.2  mm ---------------------------------------------------------------------- OB History  Gravidity:    3         Term:   1        Prem:   0        SAB:   1  TOP:          0       Ectopic:  0        Living: 1 ---------------------------------------------------------------------- Gestational Age  LMP:           34w 2d        Date:  12/25/19                 EDD:   09/30/20  Best:          34w 2d     Det. By:  LMP  (12/25/19)          EDD:   09/30/20 ---------------------------------------------------------------------- Anatomy  Ventricles:            Appears normal         Stomach:                Appears normal, left                                                                        sided  Heart:                 Appears normal         Kidneys:                Appear normal                         (4CH, axis, and                         situs)  Diaphragm:             Appears normal         Bladder:                 Appears normal  Other:  Technically difficult due to maternal habitus, advanced gestational          age, and fetal position. ---------------------------------------------------------------------- Cervix Uterus Adnexa  Cervix  Not visualized (advanced GA >24wks)  Uterus  No abnormality visualized.  Right Ovary  Within normal limits. No adnexal mass visualized.  Left Ovary  Within normal limits. No adnexal mass visualized.  Cul De Sac  No free fluid seen.  Adnexa  No abnormality visualized. ---------------------------------------------------------------------- Impression  Chronic hypertension.  Patient takes labetalol 300 mg twice  daily the dosage was  recently increased.  Blood pressures  today at our office were 147/86 and 153/83 mmHg.  Patient  does not have symptoms of severe features of preeclampsia.  She also has gestational diabetes that is well controlled on  diet.  Mild polyhydramnios is seen.  Good fetal activity is  present..Antenatal testing is reassuring. BPP 8/8.  I reassured the patient of ultrasound findings.  I discussed  blood pressure parameters and encouraged her to bring her  blood pressure cuff to our office to verify accuracy. ---------------------------------------------------------------------- Recommendations  -Continue weekly BPP till delivery. ----------------------------------------------------------------------                  Tama High, MD Electronically Signed Final Report   08/21/2020 02:40 pm ----------------------------------------------------------------------    Assessment and Plan:  Pregnancy: [id number] at [redacted]w[redacted]d 1. Chronic hypertension affecting pregnancy Increased Labetalol to 400 mg bid, no severe features. PEC precautions reviewed. BPP 8/8 today, will continue weekly until delivery.  IOL scheduled at 37 weeks (09/10/20 at midnight) given CHTN, GDM, polyhydramnios.  Offered outpatient foley placement for cervical ripening, she will consider this, information was given  to her to review at home. She was told to expect a call from [facility] L&D with further instructions about her pre-admission COVID screening and any further instructions about the induction of labor. - labetalol (NORMODYNE) 200 MG tablet; Take 2 tablets (400 mg total) by mouth 2 (two) times daily.  Dispense: 120 tablet; Refill: 5  2. Diet controlled gestational diabetes mellitus (GDM) in third trimester CBGs reviewed, within range, continue diet and exercise.  3. Polyhydramnios in third trimester complication, single or unspecified fetus AFI 27 today, still mild. Continue weekly BPP until delivery.  4. [redacted] weeks gestation of pregnancy 5. Supervision of high risk pregnancy, antepartum Preterm labor symptoms and general obstetric precautions including but not limited to vaginal bleeding, contractions, leaking of fluid and fetal movement were reviewed in detail with the patient. I discussed the assessment and treatment plan with the patient. The patient was provided an opportunity to ask questions and all were answered. The patient agreed with the plan and demonstrated an understanding of the instructions. The patient was advised to call back or seek an in-person office evaluation/go to [facility]U at [facility] for any urgent or concerning symptoms. Please refer to After Visit Summary for other counseling recommendations.   I provided 20 minutes of face-to-face time during this encounter.  Return in about 1 week (around 08/28/2020) for OFFICE [facility] Visit, Pelvic cultures.  Future Appointments  Date Time Provider [place]  08/30/2020  1:45 PM Donnamae Jude, MD [facility]  08/30/2020  3:30 PM [facility] NURSE [facility]  08/30/2020  3:45 PM [facility] US4 [facility]  09/04/2020  3:30 PM Donnamae Jude, MD [facility]  09/05/2020  3:30 PM [facility] NURSE [facility]  09/05/2020  3:45 PM [facility] US5 [facility]  09/10/2020 12:00 AM MC-LD SCHED ROOM [facility]IN[facility] None    09/12/2020  2:30 PM [facility] NURSE [facility]  09/12/2020  2:45 PM [facility] US5 [facility] [place]    Verita Schneiders, MD [facility] [place] [place]

## 2020-08-21 NOTE — Patient Instructions (Addendum)
Return to office for any scheduled appointments. Call the office or go to the MAU at [place] at [facility] if:  You begin to have strong, frequent contractions  Your water breaks.  Sometimes it is a big gush of fluid, sometimes it is just a trickle that keeps getting your panties wet or running down your legs  You have vaginal bleeding.  It is normal to have a small amount of spotting if your cervix was checked.   You do not feel your baby moving like normal.  If you do not, get something to eat and drink and lay down and focus on feeling your baby move.   If your baby is still not moving like normal, you should call the office or go to MAU.  Any other obstetric concerns.  OUTPATIENT FOLEY BULB INDUCTION OF LABOR:  Information Sheet for Mothers and Family               What's a Foley Bulb Induction? A Foley bulb induction is a procedure where your provider inserts a catheter into your cervix. Once inside your womb, your provider inflates the balloon with a saline solution.   This puts pressure on your cervix and encourages dilation. The catheter falls out once your cervix dilates to 3-4 centimeters.     With any procedure, it's important that you know what to expect. The insertion of a Foley catheter can be a bit uncomfortable, and some women experience sharp pelvic pain. The pain may subside once the catheter is in place. You may experience some cramping when the Foley catheter is in place.  This is normal.     GO TO THE MATERNITY ADMISSIONS UNIT FOR THE FOLLOWING:  Heavy vaginal bleeding  Rupture of membranes (fluid that wets your underwear)  Painful uterine contractions every 5 minutes or less  Severe abdominal discomfort  Decreased movement of the baby

## 2020-08-22 NOTE — Progress Notes (Signed)
Visit Note Addendum 08/22/2020  Patient sent MyChart message requesting to move up her scheduled 09/10/20 IOL due to pre-existing family obligations.  Induction of labor rescheduled to midnight on 09/15/20, which means her arriving to the hospital on 09/14/20 at 11:45 pm.  MyChart message sent to patient (I called her, there was no answer).  Will keep scheduled office appointment next week, to follow up her BP given change in regimen.  Since her IOL is now on 09/15/20, cultures can be done nest week or at her 36 week visit.  She is also already scheduled for weekly BPPs until 09/12/20 with MFM.  Will re-discuss pre-IOL cervical ripening with her at next visit. Will have to be done in MAU if she agrees.   Verita Schneiders, MD

## 2020-08-23 ENCOUNTER — Encounter: Payer: BC Managed Care – PPO | Admitting: Family Medicine

## 2020-08-30 ENCOUNTER — Inpatient Hospital Stay (HOSPITAL_COMMUNITY)
Admission: AD | Admit: 2020-08-30 | Discharge: 2020-09-04 | DRG: 788 | Disposition: A | Discharge status: Home / Self Care | Payer: BC Managed Care – PPO | Attending: Obstetrics and Gynecology | Admitting: Obstetrics and Gynecology

## 2020-08-30 ENCOUNTER — Other Ambulatory Visit (HOSPITAL_COMMUNITY)
Admission: RE | Admit: 2020-08-30 | Discharge: 2020-08-30 | Disposition: A | Discharge status: Home / Self Care | Payer: BC Managed Care – PPO | Source: Ambulatory Visit | Attending: Family Medicine | Admitting: Family Medicine

## 2020-08-30 ENCOUNTER — Ambulatory Visit: Payer: BC Managed Care – PPO

## 2020-08-30 ENCOUNTER — Encounter (HOSPITAL_COMMUNITY): Payer: Self-pay | Admitting: Obstetrics and Gynecology

## 2020-08-30 ENCOUNTER — Ambulatory Visit (INDEPENDENT_AMBULATORY_CARE_PROVIDER_SITE_OTHER): Payer: BC Managed Care – PPO | Admitting: Family Medicine

## 2020-08-30 ENCOUNTER — Other Ambulatory Visit: Payer: Self-pay

## 2020-08-30 VITALS — BP 175/100 | HR 74

## 2020-08-30 DIAGNOSIS — O403XX0 Polyhydramnios, third trimester, not applicable or unspecified: Secondary | ICD-10-CM

## 2020-08-30 DIAGNOSIS — O0990 Supervision of high risk pregnancy, unspecified, unspecified trimester: Secondary | ICD-10-CM

## 2020-08-30 DIAGNOSIS — O10919 Unspecified pre-existing hypertension complicating pregnancy, unspecified trimester: Secondary | ICD-10-CM

## 2020-08-30 DIAGNOSIS — O99214 Obesity complicating childbirth: Secondary | ICD-10-CM | POA: Diagnosis present

## 2020-08-30 DIAGNOSIS — Z20822 Contact with and (suspected) exposure to covid-19: Secondary | ICD-10-CM | POA: Diagnosis present

## 2020-08-30 DIAGNOSIS — O24420 Gestational diabetes mellitus in childbirth, diet controlled: Secondary | ICD-10-CM | POA: Diagnosis present

## 2020-08-30 DIAGNOSIS — O321XX0 Maternal care for breech presentation, not applicable or unspecified: Secondary | ICD-10-CM | POA: Diagnosis present

## 2020-08-30 DIAGNOSIS — O1413 Severe pre-eclampsia, third trimester: Secondary | ICD-10-CM

## 2020-08-30 DIAGNOSIS — Z3A35 35 weeks gestation of pregnancy: Secondary | ICD-10-CM

## 2020-08-30 DIAGNOSIS — O114 Pre-existing hypertension with pre-eclampsia, complicating childbirth: Secondary | ICD-10-CM | POA: Diagnosis present

## 2020-08-30 DIAGNOSIS — Z87891 Personal history of nicotine dependence: Secondary | ICD-10-CM

## 2020-08-30 DIAGNOSIS — O1002 Pre-existing essential hypertension complicating childbirth: Secondary | ICD-10-CM | POA: Diagnosis present

## 2020-08-30 DIAGNOSIS — O24410 Gestational diabetes mellitus in pregnancy, diet controlled: Secondary | ICD-10-CM

## 2020-08-30 DIAGNOSIS — E669 Obesity, unspecified: Secondary | ICD-10-CM | POA: Diagnosis present

## 2020-08-30 DIAGNOSIS — O115 Pre-existing hypertension with pre-eclampsia, complicating the puerperium: Secondary | ICD-10-CM | POA: Diagnosis not present

## 2020-08-30 DIAGNOSIS — O09529 Supervision of elderly multigravida, unspecified trimester: Secondary | ICD-10-CM

## 2020-08-30 DIAGNOSIS — O119 Pre-existing hypertension with pre-eclampsia, unspecified trimester: Secondary | ICD-10-CM | POA: Diagnosis present

## 2020-08-30 DIAGNOSIS — O09523 Supervision of elderly multigravida, third trimester: Secondary | ICD-10-CM | POA: Diagnosis present

## 2020-08-30 DIAGNOSIS — O1490 Unspecified pre-eclampsia, unspecified trimester: Secondary | ICD-10-CM | POA: Diagnosis present

## 2020-08-30 DIAGNOSIS — O1093 Unspecified pre-existing hypertension complicating the puerperium: Secondary | ICD-10-CM | POA: Diagnosis not present

## 2020-08-30 DIAGNOSIS — O1414 Severe pre-eclampsia complicating childbirth: Secondary | ICD-10-CM | POA: Diagnosis present

## 2020-08-30 DIAGNOSIS — O99210 Obesity complicating pregnancy, unspecified trimester: Secondary | ICD-10-CM | POA: Diagnosis present

## 2020-08-30 HISTORY — DX: Severe pre-eclampsia, third trimester: O14.13

## 2020-08-30 LAB — COMPREHENSIVE METABOLIC PANEL
ALT: 20 U/L (ref 0–44)
AST: 23 U/L (ref 15–41)
Albumin: 2.8 g/dL — ABNORMAL LOW (ref 3.5–5.0)
Alkaline Phosphatase: 84 U/L (ref 38–126)
Anion gap: 10 (ref 5–15)
BUN: 11 mg/dL (ref 6–20)
CO2: 19 mmol/L — ABNORMAL LOW (ref 22–32)
Calcium: 8.9 mg/dL (ref 8.9–10.3)
Chloride: 107 mmol/L (ref 98–111)
Creatinine, Ser: 0.65 mg/dL (ref 0.44–1.00)
GFR, Estimated: >60 mL/min (ref >60)
Glucose, Bld: 77 mg/dL (ref 70–99)
Potassium: 4.2 mmol/L (ref 3.5–5.1)
Sodium: 136 mmol/L (ref 135–145)
Total Bilirubin: 0.1 mg/dL — ABNORMAL LOW (ref 0.3–1.2)
Total Protein: 5.8 g/dL — ABNORMAL LOW (ref 6.5–8.1)

## 2020-08-30 LAB — GLUCOSE, CAPILLARY: Glucose-Capillary: 89 mg/dL (ref 70–99)

## 2020-08-30 LAB — CBC
HCT: 33.4 % — ABNORMAL LOW (ref 36.0–46.0)
Hemoglobin: 10.8 g/dL — ABNORMAL LOW (ref 12.0–15.0)
MCH: 26 pg (ref 26.0–34.0)
MCHC: 32.3 g/dL (ref 30.0–36.0)
MCV: 80.5 fL (ref 80.0–100.0)
Platelets: 215 10*3/uL (ref 150–400)
RBC: 4.15 MIL/uL (ref 3.87–5.11)
RDW: 13.1 % (ref 11.5–15.5)
WBC: 7 10*3/uL (ref 4.0–10.5)
nRBC: 0 % (ref 0.0–0.2)

## 2020-08-30 LAB — PROTEIN / CREATININE RATIO, URINE
Creatinine, Urine: 23.78 mg/dL
Total Protein, Urine: <6 mg/dL

## 2020-08-30 LAB — TYPE AND SCREEN
ABO/RH(D): O POS
Antibody Screen: NEGATIVE

## 2020-08-30 LAB — RESPIRATORY PANEL BY RT PCR (FLU A&B, COVID)
Influenza A by PCR: NEGATIVE
Influenza B by PCR: NEGATIVE
SARS Coronavirus 2 by RT PCR: NEGATIVE

## 2020-08-30 MED ORDER — TERBUTALINE SULFATE 1 MG/ML IJ SOLN: 0.2500 mg | Freq: Once | INTRAMUSCULAR | Status: DC

## 2020-08-30 MED ORDER — HYDRALAZINE HCL 20 MG/ML IJ SOLN
5.0000 mg | INTRAMUSCULAR | Status: DC | PRN
  Administered 2020-09-02: 5 mg via INTRAVENOUS
  Filled 2020-08-30 (×2): qty 1

## 2020-08-30 MED ORDER — HYDRALAZINE HCL 20 MG/ML IJ SOLN
5.0000 mg | INTRAMUSCULAR | Status: DC | PRN
  Administered 2020-08-30: 5 mg via INTRAVENOUS
  Filled 2020-08-30: qty 1

## 2020-08-30 MED ORDER — ACETAMINOPHEN 325 MG PO TABS
650.0000 mg | ORAL_TABLET | ORAL | Status: DC | PRN
  Administered 2020-08-30: 650 mg via ORAL
  Filled 2020-08-30: qty 2

## 2020-08-30 MED ORDER — LABETALOL HCL 5 MG/ML IV SOLN: 20.0000 mg | INTRAVENOUS | Status: DC | PRN

## 2020-08-30 MED ORDER — MAGNESIUM SULFATE BOLUS VIA INFUSION
4.0000 g | Freq: Once | INTRAVENOUS | Status: AC
  Administered 2020-08-30: 4 g via INTRAVENOUS
  Filled 2020-08-30: qty 1000

## 2020-08-30 MED ORDER — HYDRALAZINE HCL 20 MG/ML IJ SOLN
10.0000 mg | INTRAMUSCULAR | Status: DC | PRN
  Filled 2020-08-30: qty 1

## 2020-08-30 MED ORDER — LACTATED RINGERS IV SOLN: INTRAVENOUS | Status: DC

## 2020-08-30 MED ORDER — LABETALOL HCL 5 MG/ML IV SOLN
20.0000 mg | INTRAVENOUS | Status: DC | PRN
  Administered 2020-08-30: 20 mg via INTRAVENOUS
  Filled 2020-08-30: qty 4

## 2020-08-30 MED ORDER — BETAMETHASONE SOD PHOS & ACET 6 (3-3) MG/ML IJ SUSP
12.0000 mg | INTRAMUSCULAR | Status: DC
  Administered 2020-08-30: 12 mg via INTRAMUSCULAR
  Filled 2020-08-30: qty 5

## 2020-08-30 MED ORDER — CALCIUM CARBONATE ANTACID 500 MG PO CHEW: 2.0000 | CHEWABLE_TABLET | ORAL | Status: DC | PRN

## 2020-08-30 MED ORDER — DOCUSATE SODIUM 100 MG PO CAPS: 100.0000 mg | ORAL_CAPSULE | Freq: Every day | ORAL | Status: DC

## 2020-08-30 MED ORDER — MAGNESIUM SULFATE 40 GM/1000ML IV SOLN
2.0000 g/h | INTRAVENOUS | Status: AC
  Administered 2020-08-30 – 2020-08-31 (×3): 2 g/h via INTRAVENOUS
  Filled 2020-08-30: qty 1000

## 2020-08-30 MED ORDER — HYDRALAZINE HCL 20 MG/ML IJ SOLN
10.0000 mg | INTRAMUSCULAR | Status: DC | PRN
  Administered 2020-08-30: 10 mg via INTRAVENOUS

## 2020-08-30 MED ORDER — LABETALOL HCL 200 MG PO TABS
400.0000 mg | ORAL_TABLET | Freq: Two times a day (BID) | ORAL | Status: DC
  Administered 2020-08-30: 400 mg via ORAL
  Filled 2020-08-30: qty 2

## 2020-08-30 MED ORDER — LABETALOL HCL 5 MG/ML IV SOLN
40.0000 mg | INTRAVENOUS | Status: DC | PRN
  Administered 2020-08-30: 40 mg via INTRAVENOUS

## 2020-08-30 MED ORDER — LABETALOL HCL 5 MG/ML IV SOLN
40.0000 mg | INTRAVENOUS | Status: DC | PRN
  Filled 2020-08-30: qty 8

## 2020-08-30 MED ORDER — PRENATAL MULTIVITAMIN CH: 1.0000 | ORAL_TABLET | Freq: Every day | ORAL | Status: DC

## 2020-08-30 NOTE — H&P (Addendum)
FACULTY PRACTICE ANTEPARTUM ADMISSION HISTORY AND PHYSICAL NOTE   History of Present Illness: Brandy Mack is a 33 y.o. [id number] at 34w4dadmitted for new-onset severe pre-eclampsia d/t severe range Bps measured today.   Patient denies any headaches, visual symptoms, RUQ/epigastric pain or other concerning symptoms. Patient received one dose of IV Hydralazine in MAU as per protocol.  Patient reports the fetal movement as active. Patient reports uterine contraction  activity as none. Patient reports  vaginal bleeding as none. Patient describes fluid per vagina as None. Fetal presentation is breech, confirmed on exam and bedside ultrasound.  Patient Active Problem List   Diagnosis Date Noted  . Severe preeclampsia, third trimester 08/30/2020  . Pre-eclampsia 08/30/2020  . Polyhydramnios in third trimester 08/07/2020  . Chronic hypertension with superimposed severe preeclampsia 07/24/2020  . Diet controlled gestational diabetes mellitus in third trimester 07/11/2020  . AMA (advanced maternal age) multigravida 315+ third trimester 04/05/2020  . BMI 40.0-44.9, adult ([place] 04/05/2020  . History of gestational hypertension 03/07/2020  . Supervision of high risk pregnancy, antepartum 03/07/2020  . Maternal morbid obesity, [place] ([place] 03/07/2020  . Former tobacco use 03/07/2020    Past Medical History:  Diagnosis Date  . Depression    denies  . Elevated blood pressure   . Elevated blood pressure reading 05/16/2016  . Gestational diabetes   . Hypertension   . Knee pain, acute 05/16/2016  . Positive urine pregnancy test 02/07/2020    Past Surgical History:  Procedure Laterality Date  . NO PAST SURGERIES      OB History  Gravida Para Term Preterm AB Living  '4 2 2   1 2  ' SAB TAB Ectopic Multiple Live Births          2    # Outcome Date GA Lbr Len/2nd Weight Sex Delivery Anes PTL Lv  4 Current           3 AB 04/27/19    U      2 Term 09/13/08    M Vag-Spont     1 Term  09/10/01   3856 g  Vag-Spont   LIV    Social History   Socioeconomic History  . Marital status: Single    Spouse name: Not on file  . Number of children: Not on file  . Years of education: Not on file  . Highest education level: Not on file  Occupational History  . Not on file  Tobacco Use  . Smoking status: Former Smoker    Packs/day: 0.25    Years: 10.00    Pack years: 2.50    Types: Cigarettes    Quit date: 01/26/2020    Years since quitting: 0.5  . Smokeless tobacco: Never Used  Vaping Use  . Vaping Use: Never used  Substance and Sexual Activity  . Alcohol use: Not Currently    Alcohol/week: 0.0 standard drinks    Comment: social  . Drug use: No  . Sexual activity: Yes  Other Topics Concern  . Not on file  Social History Narrative   Single.   2 children.   Works at [employer]   Enjoys swimming, reading, spending time with her family.   Social Determinants of Health   Financial Resource Strain:   . Difficulty of Paying Living Expenses: Not on file  Food Insecurity:   . Worried About RCharity fundraiserin the Last Year: Not on file  . Ran Out of Food in the Last Year: Not on file  Transportation Needs:   . Film/video editor (Medical): Not on file  . Lack of Transportation (Non-Medical): Not on file  Physical Activity:   . Days of Exercise per Week: Not on file  . Minutes of Exercise per Session: Not on file  Stress:   . Feeling of Stress : Not on file  Social Connections:   . Frequency of Communication with Friends and Family: Not on file  . Frequency of Social Gatherings with Friends and Family: Not on file  . Attends Religious Services: Not on file  . Active Member of Clubs or Organizations: Not on file  . Attends Archivist Meetings: Not on file  . Marital Status: Not on file    Family History  Adopted: Yes  Problem Relation Age of Onset  . Hypertension Father   . Stroke Father   . Hyperlipidemia Mother     Allergies  Allergen  Reactions  . Naproxen Itching and Swelling    Throat swells    Medications Prior to Admission  Medication Sig Dispense Refill Last Dose  . aspirin EC 81 MG tablet Take 1 tablet (81 mg total) by mouth daily. Take after 12 weeks for prevention of preeclampsia later in pregnancy 300 tablet 2 08/30/2020 at 0700  . labetalol (NORMODYNE) 200 MG tablet Take 2 tablets (400 mg total) by mouth 2 (two) times daily. 120 tablet 5 08/30/2020 at 0700  . Prenatal Vit-Fe Fumarate-FA (MULTIVITAMIN-PRENATAL) 27-0.8 MG TABS tablet Take 1 tablet by mouth daily at 12 noon.   08/29/2020 at Unknown time  . Accu-Chek Softclix Lancets lancets 1 each by Other route 4 (four) times daily. 100 each 12   . Blood Glucose Monitoring Suppl (ACCU-CHEK GUIDE) w/Device KIT 1 Device by Does not apply route 4 (four) times daily. 1 kit 0   . glucose blood (ACCU-CHEK GUIDE) test strip Use to check blood sugars four times a day was instructed 50 each 12     Review of Systems - General ROS: positive for  - BLE edema Neurological ROS: negative for - headaches, impaired coordination/balance, numbness/tingling, seizures, visual changes or RUQ pain.   Vitals:  BP (!) 150/77   Pulse 77   Temp 98.5 F (36.9 C)   Resp 18   Ht '5\' 3"'  (1.6 m)   Wt 109.3 kg   LMP 12/25/2019   SpO2 98%   BMI 42.69 kg/m   Patient Vitals for the past 24 hrs:  BP Temp Temp src Pulse Resp SpO2 Height Weight  08/30/20 1915 (!) 150/77 -- -- 77 -- 98 % -- --  08/30/20 1900 (!) 161/90 98.5 F (36.9 C) -- 73 18 -- -- --  08/30/20 1846 (!) 147/88 -- -- 78 -- -- -- --  08/30/20 1830 -- -- -- 76 18 100 % -- --  08/30/20 1816 (!) 170/85 -- -- 80 -- -- -- --  08/30/20 1759 (!) 162/86 -- -- 75 -- -- -- --  08/30/20 1731 (!) 168/85 -- -- 31 -- -- -- --  08/30/20 1730 -- -- -- -- -- 99 % -- --  08/30/20 1713 (!) 158/79 -- -- 71 -- -- -- --  08/30/20 1700 (!) 183/115 -- -- 76 -- 100 % -- --  08/30/20 1631 (!) 174/105 -- -- 67 -- -- -- --  08/30/20 1615 (!) 180/101  -- -- 67 -- 100 % -- --  08/30/20 1608 (!) 178/96 98.5 F (36.9 C) Axillary (!) 58 18 100 % '5\' 3"'  (1.6 m)  109.3 kg    Physical Examination: CONSTITUTIONAL: Well-developed, well-nourished female in no acute distress.  HENT:  Normocephalic, atraumatic SKIN: Skin is warm and dry. No rash noted. Not diaphoretic. No erythema. No pallor. Reagan: Alert and oriented to person, place, and time. Normal reflexes, muscle tone coordination. No cranial nerve deficit noted. PSYCHIATRIC: Normal mood and affect. Normal behavior. Normal judgment and thought content. CARDIOVASCULAR: Normal heart rate noted.  RESPIRATORY: Effort and breath sounds normal, no problems with respiration noted ABDOMEN: Soft, nontender, nondistended, gravid. MUSCULOSKELETAL: Normal range of motion. No edema and no tenderness. 2+ distal pulses.  Cervix: Not evaluated Fetal presentation: Breech on bedside ultrasound. Membranes: intact Fetal Monitoring:Baseline: 130 bpm, Variability: Good {> 6 bpm), Accelerations: Reactive and Decelerations: Absent Tocometer: Flat  Labs:  Results for orders placed or performed during the hospital encounter of 08/30/20 (from the past 24 hour(s))  Protein / creatinine ratio, urine   Collection Time: 08/30/20  4:13 PM  Result Value Ref Range   Creatinine, Urine 23.78 mg/dL   Total Protein, Urine <6 mg/dL   Protein Creatinine Ratio        0.00 - 0.15 mg/mg[Cre]  Type and screen [facility]   Collection Time: 08/30/20  4:30 PM  Result Value Ref Range   ABO/RH(D) O POS    Antibody Screen NEG    Sample Expiration      09/02/2020,2359 Performed at [facility], [place] [address] [place], [place] [address]   CBC   Collection Time: 08/30/20  4:37 PM  Result Value Ref Range   WBC 7.0 4.0 - 10.5 K/uL   RBC 4.15 3.87 - 5.11 MIL/uL   Hemoglobin 10.8 (L) 12.0 - 15.0 g/dL   HCT 33.4 (L) 36 - 46 %   MCV 80.5 80.0 - 100.0 fL   MCH 26.0 26.0 - 34.0 pg   MCHC 32.3 30.0 - 36.0  g/dL   RDW 13.1 11.5 - 15.5 %   Platelets 215 150 - 400 K/uL   nRBC 0.0 0.0 - 0.2 %  Comprehensive metabolic panel   Collection Time: 08/30/20  4:37 PM  Result Value Ref Range   Sodium 136 135 - 145 mmol/L   Potassium 4.2 3.5 - 5.1 mmol/L   Chloride 107 98 - 111 mmol/L   CO2 19 (L) 22 - 32 mmol/L   Glucose, Bld 77 70 - 99 mg/dL   BUN 11 6 - 20 mg/dL   Creatinine, Ser 0.65 0.44 - 1.00 mg/dL   Calcium 8.9 8.9 - 10.3 mg/dL   Total Protein 5.8 (L) 6.5 - 8.1 g/dL   Albumin 2.8 (L) 3.5 - 5.0 g/dL   AST 23 15 - 41 U/L   ALT 20 0 - 44 U/L   Alkaline Phosphatase 84 38 - 126 U/L   Total Bilirubin 0.1 (L) 0.3 - 1.2 mg/dL   GFR, Estimated >60 >60 mL/min   Anion gap 10 5 - 15  Respiratory Panel by RT PCR (Flu A&B, Covid) - Nasopharyngeal Swab   Collection Time: 08/30/20  5:21 PM   Specimen: Nasopharyngeal Swab  Result Value Ref Range   SARS Coronavirus 2 by RT PCR NEGATIVE NEGATIVE   Influenza A by PCR NEGATIVE NEGATIVE   Influenza B by PCR NEGATIVE NEGATIVE  Glucose, capillary   Collection Time: 08/30/20  6:02 PM  Result Value Ref Range   Glucose-Capillary 89 70 - 99 mg/dL    Imaging Studies: [place] MFM FETAL BPP WO NON STRESS  Result Date:  08/21/2020 ----------------------------------------------------------------------  OBSTETRICS REPORT                       (Signed Final 08/21/2020 02:40 pm) ---------------------------------------------------------------------- Patient Info  ID #:       [id number]                          D.O.B.:  [birth year] (34 yrs)  Name:       Brandy Mack                  Visit Date: 08/21/2020 02:24 pm ---------------------------------------------------------------------- Performed By  Attending:        Tama High MD        Ref. Address:     [place]  Performed By:     Jacob Moores BS,       [facility]                    RDMS, RVT                                Fetal  Care at                                                             [employer] for                                                             Women  Referred By:      [facility] ([facility]) ---------------------------------------------------------------------- Orders  #  Description                           Code        Ordered By  1  [place] MFM FETAL BPP WO NON               52841.32    [facility] ----------------------------------------------------------------------  #  Order #                     Accession #                Episode #  1  [id number]                   [id number]                 [id number] ---------------------------------------------------------------------- Indications  [redacted] weeks gestation of pregnancy  Z3A.34  Hypertension - Chronic/Pre-existing            O10.019  (labetalol)  Advanced maternal age multigravida 67+,        O3.523  third trimester (low risk NIPS)  Obesity complicating pregnancy, third          O99.213  trimester (pregravid BMI 42)  Uterine fibroids affecting pregnancy in third  O34.13, D25.9  trimester, antepartum  Gestational diabetes in pregnancy, diet        O24.410  controlled  Encounter for other antenatal screening        Z36.2  follow-up ---------------------------------------------------------------------- Fetal Evaluation  Num Of Fetuses:         1  Fetal Heart Rate(bpm):  136  Cardiac Activity:       Observed  Presentation:           Cephalic  Placenta:               Anterior  P. Cord Insertion:      Previously Visualized  Amniotic Fluid  AFI FV:      Polyhydramnios  AFI Sum(cm)     %Tile       Largest Pocket(cm)  27.8            97          8  RUQ(cm)       RLQ(cm)       LUQ(cm)        LLQ(cm)  7.8           6.5           8              5.6 ---------------------------------------------------------------------- Biophysical Evaluation  Amniotic F.V:   Pocket => 2 cm             F. Tone:        Observed  F. Movement:    Observed                   Score:           8/8  F. Breathing:   Observed ---------------------------------------------------------------------- Biometry  LV:        3.2  mm ---------------------------------------------------------------------- OB History  Gravidity:    3         Term:   1        Prem:   0        SAB:   1  TOP:          0       Ectopic:  0        Living: 1 ---------------------------------------------------------------------- Gestational Age  LMP:           34w 2d        Date:  12/25/19                 EDD:   09/30/20  Best:          34w 2d     Det. By:  LMP  (12/25/19)          EDD:   09/30/20 ---------------------------------------------------------------------- Anatomy  Ventricles:            Appears normal         Stomach:                Appears normal, left  sided  Heart:                 Appears normal         Kidneys:                Appear normal                         (4CH, axis, and                         situs)  Diaphragm:             Appears normal         Bladder:                Appears normal  Other:  Technically difficult due to maternal habitus, advanced gestational          age, and fetal position. ---------------------------------------------------------------------- Cervix Uterus Adnexa  Cervix  Not visualized (advanced GA >24wks)  Uterus  No abnormality visualized.  Right Ovary  Within normal limits. No adnexal mass visualized.  Left Ovary  Within normal limits. No adnexal mass visualized.  Cul De Sac  No free fluid seen.  Adnexa  No abnormality visualized. ---------------------------------------------------------------------- Impression  Chronic hypertension.  Patient takes labetalol 300 mg twice  daily the dosage was recently increased.  Blood pressures  today at our office were 147/86 and 153/83 mmHg.  Patient  does not have symptoms of severe features of preeclampsia.  She also has gestational diabetes that is well controlled on  diet.  Mild  polyhydramnios is seen.  Good fetal activity is  present..Antenatal testing is reassuring. BPP 8/8.  I reassured the patient of ultrasound findings.  I discussed  blood pressure parameters and encouraged her to bring her  blood pressure cuff to our office to verify accuracy. ---------------------------------------------------------------------- Recommendations  -Continue weekly BPP till delivery. ----------------------------------------------------------------------                  Tama High, MD Electronically Signed Final Report   08/21/2020 02:40 pm ----------------------------------------------------------------------  [place] MFM FETAL BPP WO NON STRESS  Result Date: 08/15/2020 ----------------------------------------------------------------------  OBSTETRICS REPORT                       (Signed Final 08/15/2020 02:53 pm) ---------------------------------------------------------------------- Patient Info  ID #:       [id number]                          D.O.B.:  Jan 19, 1986 (34 yrs)  Name:       Brandy Mack                  Visit Date: 08/15/2020 02:26 pm ---------------------------------------------------------------------- Performed By  Attending:        Johnell Comings MD         Ref. Address:     [place]  Performed By:     Jeanene Erb BS,      Location[facility]  [facility]                    RDMS                                     Fetal Care at                                                             [place] for                                                             Women  Referred By:      [facility] ---------------------------------------------------------------------- Orders  #  Description                           Code        Ordered By  1  [place] MFM FETAL BPP WO NON               76819.01    [facility]     STRESS ----------------------------------------------------------------------  #  Order #                      Accession #                Episode #  1  [id number]                   [id number]                 [id number] ---------------------------------------------------------------------- Indications  Hypertension - Chronic/Pre-existing            O10.019  (labetalol)  Advanced maternal age multigravida 24+,        O55.523  third trimester (low risk NIPS)  Obesity complicating pregnancy, third          O99.213  trimester (pregravid BMI 42)  Uterine fibroids affecting pregnancy in third  O34.13, D25.9  trimester, antepartum  Gestational diabetes in pregnancy, diet        O24.410  controlled  [redacted] weeks gestation of pregnancy                Z3A.33 ---------------------------------------------------------------------- Fetal Evaluation  Num Of Fetuses:         1  Fetal Heart Rate(bpm):  121  Cardiac Activity:       Observed  Presentation:           Cephalic  Placenta:               Anterior  P. Cord Insertion:      Previously Visualized  Amniotic Fluid  AFI FV:      Subjectively upper-normal  AFI Sum(cm)     %Tile       Largest Pocket(cm)  24.96           95          7.35  RUQ(cm)  RLQ(cm)       LUQ(cm)        LLQ(cm)  7.26          5.47          4.88           7.35 ---------------------------------------------------------------------- Biophysical Evaluation  Amniotic F.V:   Pocket => 2 cm             F. Tone:        Observed  F. Movement:    Observed                   Score:          8/8  F. Breathing:   Observed ---------------------------------------------------------------------- OB History  Gravidity:    3         Term:   1        Prem:   0        SAB:   1  TOP:          0       Ectopic:  0        Living: 1 ---------------------------------------------------------------------- Gestational Age  LMP:           33w 3d        Date:  12/25/19                 EDD:   09/30/20  Best:          33w 3d     Det. By:  LMP  (12/25/19)          EDD:   09/30/20 ----------------------------------------------------------------------  Anatomy  Diaphragm:             Appears normal         Bladder:                Visualized  Stomach:               Visualized ---------------------------------------------------------------------- Cervix Uterus Adnexa  Cervix  Not visualized (advanced GA >24wks) ---------------------------------------------------------------------- Comments  This patient was seen for a biophysical profile due to chronic  hypertension currently treated with labetalol 300 mg twice a  day.  She reports that her labetalol dose was just increased  last week.  Her blood pressures in our office today were  137/90 and 149/99.  The patient denies any symptoms  associated with preeclampsia.  She is monitoring her blood  pressures at home and reports that her blood pressures at  home have been in the 130s over 90s range.  A biophysical profile performed today was 8 out of 8.  Borderline polyhydramnios continues to be noted today.  Preeclampsia precautions were reviewed with the patient  today.  She was advised to call should her blood pressures at  home be persistently greater than 150s over high 90s range  or should she develop a severe headache.  The patient should continue weekly fetal testing until delivery.  Should her blood pressures continue to remain elevated  despite medication treatment, delivery may be considered at  around 37 weeks.  Another biophysical profile scheduled in 1 week. ----------------------------------------------------------------------                   Johnell Comings, MD Electronically Signed Final Report   08/15/2020 02:53 pm ----------------------------------------------------------------------  [place] MFM FETAL BPP WO NON STRESS  Result Date: 08/07/2020 ----------------------------------------------------------------------  OBSTETRICS REPORT                       (  Signed Final 08/07/2020 02:33 pm) ---------------------------------------------------------------------- Patient Info  ID #:       [id number]                           D.O.B.:  1986/08/19 (34 yrs)  Name:       Brandy Mack                  Visit Date: 08/07/2020 02:15 pm ---------------------------------------------------------------------- Performed By  Attending:        Sander Nephew      Ref. Address:     [place]  Performed By:     Jeanene Erb BS,      Location[facility]                    RDMS                                     Fetal Care at                                                             [place] for                                                             Women  Referred By:      [facility] ---------------------------------------------------------------------- Orders  #  Description                           Code        Ordered By  1  [place] MFM FETAL BPP WO NON               56213.08    [place] ----------------------------------------------------------------------  #  Order #                     Accession #                Episode #  1  [id number]                   [id number]  [id number] ---------------------------------------------------------------------- Indications  Hypertension - Chronic/Pre-existing            O10.019  (labetalol)  Advanced maternal age multigravida 37+,        O46.523  third trimester (low risk NIPS)  Obesity complicating pregnancy, third          O99.213  trimester (pregravid BMI 42)  Uterine fibroids affecting pregnancy in third  O34.13, D25.9  trimester, antepartum  Gestational diabetes in pregnancy, diet        O24.410  controlled  [redacted] weeks gestation of pregnancy                Z3A.32 ---------------------------------------------------------------------- Fetal Evaluation  Num Of Fetuses:         1  Fetal Heart Rate(bpm):  133  Cardiac Activity:       Observed  Presentation:           Cephalic  Placenta:                Anterior  P. Cord Insertion:      Previously Visualized  Amniotic Fluid  AFI FV:      Polyhydramnios  AFI Sum(cm)     %Tile       Largest Pocket(cm)  25.08           96          7.53  RUQ(cm)       RLQ(cm)       LUQ(cm)        LLQ(cm)  7.53          5.37          6.45           5.73 ---------------------------------------------------------------------- Biophysical Evaluation  Amniotic F.V:   Polyhydramnios             F. Tone:        Observed  F. Movement:    Observed                   Score:          8/8  F. Breathing:   Observed ---------------------------------------------------------------------- OB History  Gravidity:    3         Term:   1        Prem:   0        SAB:   1  TOP:          0       Ectopic:  0        Living: 1 ---------------------------------------------------------------------- Gestational Age  LMP:           32w 2d        Date:  12/25/19                 EDD:   09/30/20  Best:          Milderd Meager 2d     Det. By:  LMP  (12/25/19)          EDD:   09/30/20 ---------------------------------------------------------------------- Anatomy  Stomach:               Visualized             Bladder:                Visualized ---------------------------------------------------------------------- Impression  Antenatal testing performed given maternal chronic  hypertension on labetalol, A1GDM and elevated BMI of 42.  The biophysical profile was 8/8 with good fetal movement  and  amniotic fluid volume.  Mild polyhydramnios is observed today, Ms. Estey reports  that her blood sugars have been normal FBS <90 with 1-2  2hr PP > 120. She reports good fetal movement. At this time I  suspect that this is idiopathic. Management does not change  given she is scheduled for weekly testing due to chronic  hypertension.  Blood pressure today was 143/93 and 151/81. She is  asymptomatic taking labetalol 200 mg BID ---------------------------------------------------------------------- Recommendations  Continue weekly testing  Repeat  growth in 2 weeks. ----------------------------------------------------------------------               Sander Nephew, MD Electronically Signed Final Report   08/07/2020 02:33 pm ----------------------------------------------------------------------    Assessment and Plan: Patient Active Problem List   Diagnosis Date Noted  . Severe preeclampsia, third trimester 08/30/2020  . Pre-eclampsia 08/30/2020  . Polyhydramnios in third trimester 08/07/2020  . Chronic hypertension with superimposed severe preeclampsia 07/24/2020  . Diet controlled gestational diabetes mellitus in third trimester 07/11/2020  . AMA (advanced maternal age) multigravida 13+, third trimester 04/05/2020  . BMI 40.0-44.9, adult ([place]) 04/05/2020  . History of gestational hypertension 03/07/2020  . Supervision of high risk pregnancy, antepartum 03/07/2020  . Maternal morbid obesity, antepartum ([place]) 03/07/2020  . Former tobacco use 03/07/2020   Admit to [place] Continue Hydralazine protocol for severe BP, will continue oral Labetalol for now Magnesium sulfate to be given for eclampsia prophylaxis Category 1 FHR tracing. Betamethasone regimen ordered, Neonatalogy consulted Will follow CBGs, will be on clears liquids then NPO after midnight Discussed implications of breech presentation; offered external cephalic version vs planned cesarean delivery. Risks/benefits of all modalities discussed in detail. Patient desires ECV, this will be done in the morning by Dr. Kennon Rounds, scheduled at 10 am 08/31/20.  NPO after midnight. IOL to follow if successful, if unsuccessful, cesarean delivery.   Patient understands that if delivery is needed overnight, will likely need cesarean delivery due to malpresentation but will recheck presentation.  Routine antenatal care   Verita Schneiders, MD, [place] Attending [place], [facility]

## 2020-08-30 NOTE — Patient Instructions (Signed)

## 2020-08-30 NOTE — Progress Notes (Signed)
Blood work being drawn

## 2020-08-30 NOTE — MAU Note (Signed)
Sent from office, elevated BP, on Labetalol.  Denies HA, visual changes, epigastric pain or increase in swelling.  Denies any bleeding or LOF.

## 2020-08-30 NOTE — MAU Provider Note (Addendum)
History     CSN: [id number]  Arrival date and time: 08/30/20 1528   First Provider Initiated Contact with Patient 08/30/20 1601      Chief Complaint  Patient presents with  . Hypertension   Brandy Mack is a 34 yo [phone] currently with live IUP at 35.4w gestation who presented from clinic for chronic HTN. Other prenatal history of A1GDM and polyhydramnios, currently going for weekly BPPs. She reports bilateral edema present and unchanged throughout the entire pregnancy. No headaches, vision changes, LUQ pain, chest pain, or difficulty breathing. Has had labile HTN for the last 10 years, but has not been on medication except for 10 years ago. Was started on labetalol as soon as she started prenatal care. Currently taking labetalol 400 mg BID per OB provider.    OB History     Gravida  4   Para  2   Term  2   Preterm      AB  1   Living  2      SAB      TAB      Ectopic      Multiple      Live Births  2           Past Medical History:  Diagnosis Date  . Depression   . Elevated blood pressure   . Elevated blood pressure reading 05/16/2016  . Gestational diabetes   . Hypertension   . Knee pain, acute 05/16/2016  . Positive urine pregnancy test 02/07/2020    No past surgical history on file.  Family History  Adopted: Yes  Problem Relation Age of Onset  . Hypertension Father   . Stroke Father     Social History   Tobacco Use  . Smoking status: Former Smoker    Packs/day: 0.25    Years: 10.00    Pack years: 2.50    Types: Cigarettes    Quit date: 01/26/2020    Years since quitting: 0.5  . Smokeless tobacco: Never Used  Vaping Use  . Vaping Use: Never used  Substance Use Topics  . Alcohol use: Not Currently    Alcohol/week: 0.0 standard drinks    Comment: social  . Drug use: No    Allergies:  Allergies  Allergen Reactions  . Naproxen Itching and Swelling    Medications Prior to Admission  Medication Sig Dispense Refill Last Dose  .  Accu-Chek Softclix Lancets lancets 1 each by Other route 4 (four) times daily. 100 each 12   . aspirin EC 81 MG tablet Take 1 tablet (81 mg total) by mouth daily. Take after 12 weeks for prevention of preeclampsia later in pregnancy 300 tablet 2   . Blood Glucose Monitoring Suppl (ACCU-CHEK GUIDE) w/Device KIT 1 Device by Does not apply route 4 (four) times daily. 1 kit 0   . glucose blood (ACCU-CHEK GUIDE) test strip Use to check blood sugars four times a day was instructed 50 each 12   . labetalol (NORMODYNE) 200 MG tablet Take 2 tablets (400 mg total) by mouth 2 (two) times daily. 120 tablet 5   . Prenatal Vit-Fe Fumarate-FA (MULTIVITAMIN-PRENATAL) 27-0.8 MG TABS tablet Take 1 tablet by mouth daily at 12 noon.       Review of Systems  Constitutional: Negative for chills and fatigue.  Eyes: Negative for photophobia and visual disturbance.  Respiratory: Negative for cough and shortness of breath.   Cardiovascular: Positive for leg swelling (Moderate pitting BLE edema). Negative for chest  pain.  Gastrointestinal: Negative for abdominal pain.  Genitourinary: Negative for flank pain, pelvic pain, vaginal bleeding and vaginal discharge.  Neurological: Negative for dizziness, seizures and headaches.   Physical Exam   Last menstrual period 12/25/2019.  Physical Exam Constitutional:      Appearance: Normal appearance. She is obese. She is not ill-appearing or toxic-appearing.  HENT:     Head: Normocephalic.  Cardiovascular:     Rate and Rhythm: Normal rate.  Pulmonary:     Effort: Pulmonary effort is normal.  Skin:    General: Skin is warm.  Neurological:     General: No focal deficit present.     Mental Status: She is alert and oriented to person, place, and time.  Psychiatric:        Mood and Affect: Mood normal.        Behavior: Behavior normal.     MAU Course  Procedures  MDM Brandy Mack is a 34 yo [phone] PMHx cHTN currently with live IUP at 35.4w gestation who presented  from clinic for new-onset severe range pressures despite adherence to labetalol 400 mg BID. Initial BP's on admission 178/96, 180/101, and 174/105. We obtained pre-E labs along with type and screen, GBS swab, GC/Chlamydia swab.   Pre-E labs all within normal limits - urine protein too low to report P:C, PLT 215, AST 23, and ALT 20. Aside from stable moderate pitting edema in BLE, pt denied all other pre-E severe features.   Given severe range BP requiring initiation of mag, betamethasone, and hydralazine, we consulted Dr. Harolyn Rutherford and will admit to [facility] specialty care. After discussion between Dr. Harolyn Rutherford and pt, plan to attempt fetal version tomorrow morning at 10am with Dr. Kennon Rounds.   Assessment and Plan  A: Chronic HTN with severe range pressures. No lab evidence of pre-E.   P: Admit to antepartum with Dr. Harolyn Rutherford attending with plan to attempt fetal version 10am tomorrow with Dr. Kennon Rounds with subsequent vaginal delivery.   Ezequiel Essex 08/30/2020, 4:01 PM   CNM attestation:  I have seen and examined this patient and agree with above documentation in the residents note.   Brandy Mack is a 34 y.o. [id number] at 36w4dreporting for BP check.  +FM, denies LOF, VB, contractions, vaginal discharge.  PE: Patient Vitals for the past 24 hrs:  BP Temp Temp src Pulse Resp SpO2 Height Weight  08/30/20 1816 (!) 170/85 -- -- 80 -- -- -- --  08/30/20 1759 (!) 162/86 -- -- 75 -- -- -- --  08/30/20 1731 (!) 168/85 -- -- 714-- -- -- --  08/30/20 1730 -- -- -- -- -- 99 % -- --  08/30/20 1713 (!) 158/79 -- -- 71 -- -- -- --  08/30/20 1700 (!) 183/115 -- -- 76 -- 100 % -- --  08/30/20 1631 (!) 174/105 -- -- 67 -- -- -- --  08/30/20 1615 (!) 180/101 -- -- 67 -- 100 % -- --  08/30/20 1608 (!) 178/96 98.5 F (36.9 C) Axillary (!) 58 18 100 % _0  (1.6 m) 241 lb (109.3 kg)   Gen: calm comfortable, NAD Resp: normal effort, no distress Heart: Regular rate Abd: Soft, NT, gravid, S=D  FHR: Baseline 125,  Mod Variability, pos accels, no decels Toco: UC's Q none  ROS, labs, PMH reviewed  Orders Placed This Encounter  Procedures  . Respiratory Panel by RT PCR (Flu A&B, Covid) - Nasopharyngeal Swab  . CBC  . Comprehensive metabolic panel  . Protein / creatinine  ratio, urine  . Comprehensive metabolic panel  . CBC  . Glucose, capillary  . Diet clear liquid Room service appropriate? Yes; Fluid consistency: Thin  . Diet NPO time specified  . Notify Physician  . Measure blood pressure  . Notify physician (specify)  . Vital signs  . Defer vaginal exam for vaginal bleeding or PROM <37 weeks  . Initiate Oral Care Protocol  . Initiate Carrier Fluid Protocol  . Continuous tocometry  . Bed rest with bathroom privileges  . Notify Physician  . Measure blood pressure  . Full code  . Type and screen [place]  . Insert peripheral IV  . Admit to Inpatient (patient's expected length of stay will be greater than 2 midnights or inpatient only procedure)   Meds ordered this encounter  Medications  . AND Linked Order Group   . hydrALAZINE (APRESOLINE) injection 5 mg   . hydrALAZINE (APRESOLINE) injection 10 mg   . labetalol (NORMODYNE) injection 20 mg   . labetalol (NORMODYNE) injection 40 mg  . lactated ringers infusion  . acetaminophen (TYLENOL) tablet 650 mg  . docusate sodium (COLACE) capsule 100 mg  . calcium carbonate (TUMS - dosed in mg elemental calcium) chewable tablet 400 mg of elemental calcium  . prenatal multivitamin tablet 1 tablet  . lactated ringers infusion  . betamethasone acetate-betamethasone sodium phosphate (CELESTONE) injection 12 mg  . magnesium bolus via infusion 4 g  . magnesium sulfate 40 grams in SWI 1000 mL OB infusion  . AND Linked Order Group   . hydrALAZINE (APRESOLINE) injection 5 mg   . hydrALAZINE (APRESOLINE) injection 10 mg   . labetalol (NORMODYNE) injection 20 mg   . labetalol (NORMODYNE) injection 40 mg     MDM -  Assessment: No diagnosis found.  Plan: Admit to [facility] unit per consult with Anyanwu, Ugonna A, MD. -Admission orders placed, including NPO after midnight -Continuous FM -bmz x 2 ordered, mag, GBS cultures and GC.  -Covid swab pending  [place], CNM 08/30/2020 6:22 PM

## 2020-08-31 ENCOUNTER — Encounter (HOSPITAL_COMMUNITY): Payer: BC Managed Care – PPO

## 2020-08-31 ENCOUNTER — Observation Stay (HOSPITAL_COMMUNITY): Payer: BC Managed Care – PPO

## 2020-08-31 ENCOUNTER — Inpatient Hospital Stay (HOSPITAL_COMMUNITY): Payer: BC Managed Care – PPO | Admitting: Anesthesiology

## 2020-08-31 ENCOUNTER — Encounter (HOSPITAL_COMMUNITY): Payer: Self-pay | Admitting: Obstetrics & Gynecology

## 2020-08-31 ENCOUNTER — Inpatient Hospital Stay (HOSPITAL_COMMUNITY)
Admission: AD | Admit: 2020-08-31 | Payer: BC Managed Care – PPO | Source: Home / Self Care | Admitting: Obstetrics & Gynecology

## 2020-08-31 ENCOUNTER — Encounter (HOSPITAL_COMMUNITY)
Admission: AD | Disposition: A | Discharge status: Home / Self Care | Payer: Self-pay | Source: Home / Self Care | Attending: Obstetrics & Gynecology

## 2020-08-31 ENCOUNTER — Other Ambulatory Visit: Payer: Self-pay

## 2020-08-31 DIAGNOSIS — Z3A35 35 weeks gestation of pregnancy: Secondary | ICD-10-CM

## 2020-08-31 DIAGNOSIS — O321XX0 Maternal care for breech presentation, not applicable or unspecified: Secondary | ICD-10-CM

## 2020-08-31 DIAGNOSIS — O1414 Severe pre-eclampsia complicating childbirth: Secondary | ICD-10-CM

## 2020-08-31 DIAGNOSIS — O1413 Severe pre-eclampsia, third trimester: Secondary | ICD-10-CM

## 2020-08-31 DIAGNOSIS — O114 Pre-existing hypertension with pre-eclampsia, complicating childbirth: Secondary | ICD-10-CM

## 2020-08-31 LAB — GC/CHLAMYDIA PROBE AMP (~~LOC~~) NOT AT ARMC
Chlamydia: NEGATIVE
Comment: NEGATIVE
Comment: NORMAL
Neisseria Gonorrhea: NEGATIVE

## 2020-08-31 LAB — COMPREHENSIVE METABOLIC PANEL
ALT: 23 U/L (ref 0–44)
AST: 21 U/L (ref 15–41)
Albumin: 2.9 g/dL — ABNORMAL LOW (ref 3.5–5.0)
Alkaline Phosphatase: 86 U/L (ref 38–126)
Anion gap: 14 (ref 5–15)
BUN: 8 mg/dL (ref 6–20)
CO2: 16 mmol/L — ABNORMAL LOW (ref 22–32)
Calcium: 7.9 mg/dL — ABNORMAL LOW (ref 8.9–10.3)
Chloride: 104 mmol/L (ref 98–111)
Creatinine, Ser: 0.65 mg/dL (ref 0.44–1.00)
GFR, Estimated: >60 mL/min (ref >60)
Glucose, Bld: 106 mg/dL — ABNORMAL HIGH (ref 70–99)
Potassium: 4.1 mmol/L (ref 3.5–5.1)
Sodium: 134 mmol/L — ABNORMAL LOW (ref 135–145)
Total Bilirubin: 0.7 mg/dL (ref 0.3–1.2)
Total Protein: 6.1 g/dL — ABNORMAL LOW (ref 6.5–8.1)

## 2020-08-31 LAB — CBC
HCT: 34.3 % — ABNORMAL LOW (ref 36.0–46.0)
Hemoglobin: 11.1 g/dL — ABNORMAL LOW (ref 12.0–15.0)
MCH: 26.4 pg (ref 26.0–34.0)
MCHC: 32.4 g/dL (ref 30.0–36.0)
MCV: 81.5 fL (ref 80.0–100.0)
Platelets: 226 10*3/uL (ref 150–400)
RBC: 4.21 MIL/uL (ref 3.87–5.11)
RDW: 13.2 % (ref 11.5–15.5)
WBC: 7.8 10*3/uL (ref 4.0–10.5)
nRBC: 0 % (ref 0.0–0.2)

## 2020-08-31 LAB — GLUCOSE, CAPILLARY: Glucose-Capillary: 101 mg/dL — ABNORMAL HIGH (ref 70–99)

## 2020-08-31 SURGERY — Surgical Case
Anesthesia: Spinal

## 2020-08-31 MED ORDER — MORPHINE SULFATE (PF) 0.5 MG/ML IJ SOLN
INTRAMUSCULAR | Status: AC
  Filled 2020-08-31: qty 10

## 2020-08-31 MED ORDER — COCONUT OIL OIL
1.0000 "application " | TOPICAL_OIL | Status: DC | PRN
  Administered 2020-09-02: 1 via TOPICAL

## 2020-08-31 MED ORDER — IBUPROFEN 800 MG PO TABS: 800.0000 mg | ORAL_TABLET | Freq: Four times a day (QID) | ORAL | Status: DC

## 2020-08-31 MED ORDER — PHENYLEPHRINE HCL-NACL 20-0.9 MG/250ML-% IV SOLN
INTRAVENOUS | Status: DC | PRN
  Administered 2020-08-31: 60 ug/min via INTRAVENOUS

## 2020-08-31 MED ORDER — ENOXAPARIN SODIUM 60 MG/0.6ML ~~LOC~~ SOLN
0.5000 mg/kg | SUBCUTANEOUS | Status: DC
  Administered 2020-09-01 – 2020-09-04 (×4): 55 mg via SUBCUTANEOUS
  Filled 2020-08-31 (×4): qty 0.6

## 2020-08-31 MED ORDER — OXYTOCIN-SODIUM CHLORIDE 30-0.9 UT/500ML-% IV SOLN
INTRAVENOUS | Status: AC
  Filled 2020-08-31: qty 500

## 2020-08-31 MED ORDER — FENTANYL CITRATE (PF) 100 MCG/2ML IJ SOLN
INTRAMUSCULAR | Status: DC | PRN
  Administered 2020-08-31: 15 ug via INTRATHECAL

## 2020-08-31 MED ORDER — OXYCODONE HCL 5 MG/5ML PO SOLN: 5.0000 mg | Freq: Once | ORAL | Status: DC | PRN

## 2020-08-31 MED ORDER — NALBUPHINE HCL 10 MG/ML IJ SOLN: 5.0000 mg | INTRAMUSCULAR | Status: DC | PRN

## 2020-08-31 MED ORDER — SIMETHICONE 80 MG PO CHEW
80.0000 mg | CHEWABLE_TABLET | ORAL | Status: DC
  Administered 2020-09-01 – 2020-09-03 (×4): 80 mg via ORAL
  Filled 2020-08-31 (×4): qty 1

## 2020-08-31 MED ORDER — OXYCODONE HCL 5 MG PO TABS: 5.0000 mg | ORAL_TABLET | Freq: Once | ORAL | Status: DC | PRN

## 2020-08-31 MED ORDER — SIMETHICONE 80 MG PO CHEW: 80.0000 mg | CHEWABLE_TABLET | ORAL | Status: DC | PRN

## 2020-08-31 MED ORDER — MIDAZOLAM HCL 2 MG/2ML IJ SOLN
INTRAMUSCULAR | Status: AC
  Filled 2020-08-31: qty 2

## 2020-08-31 MED ORDER — OXYCODONE HCL 5 MG PO TABS
5.0000 mg | ORAL_TABLET | ORAL | Status: DC | PRN
  Administered 2020-09-01: 5 mg via ORAL
  Administered 2020-09-02 (×4): 10 mg via ORAL
  Administered 2020-09-03 (×2): 5 mg via ORAL
  Filled 2020-08-31 (×3): qty 1
  Filled 2020-08-31: qty 2
  Filled 2020-08-31: qty 1
  Filled 2020-08-31 (×3): qty 2

## 2020-08-31 MED ORDER — SCOPOLAMINE 1 MG/3DAYS TD PT72
1.0000 | MEDICATED_PATCH | Freq: Once | TRANSDERMAL | Status: AC
  Administered 2020-08-31: 1.5 mg via TRANSDERMAL

## 2020-08-31 MED ORDER — LACTATED RINGERS IV SOLN: INTRAVENOUS | Status: DC

## 2020-08-31 MED ORDER — FENTANYL CITRATE (PF) 100 MCG/2ML IJ SOLN
INTRAMUSCULAR | Status: AC
  Filled 2020-08-31: qty 2

## 2020-08-31 MED ORDER — SUCCINYLCHOLINE CHLORIDE 200 MG/10ML IV SOSY
PREFILLED_SYRINGE | INTRAVENOUS | Status: AC
  Filled 2020-08-31: qty 10

## 2020-08-31 MED ORDER — PROMETHAZINE HCL 25 MG/ML IJ SOLN: 6.2500 mg | INTRAMUSCULAR | Status: DC | PRN

## 2020-08-31 MED ORDER — PHENYLEPHRINE HCL-NACL 20-0.9 MG/250ML-% IV SOLN
INTRAVENOUS | Status: AC
  Filled 2020-08-31: qty 250

## 2020-08-31 MED ORDER — DIPHENHYDRAMINE HCL 50 MG/ML IJ SOLN: 12.5000 mg | INTRAMUSCULAR | Status: DC | PRN

## 2020-08-31 MED ORDER — MEPERIDINE HCL 25 MG/ML IJ SOLN: 6.2500 mg | INTRAMUSCULAR | Status: DC | PRN

## 2020-08-31 MED ORDER — MAGNESIUM SULFATE 40 GM/1000ML IV SOLN
INTRAVENOUS | Status: AC
  Filled 2020-08-31: qty 1000

## 2020-08-31 MED ORDER — MENTHOL 3 MG MT LOZG: 1.0000 | LOZENGE | OROMUCOSAL | Status: DC | PRN

## 2020-08-31 MED ORDER — TERBUTALINE SULFATE 1 MG/ML IJ SOLN
INTRAMUSCULAR | Status: AC
  Filled 2020-08-31: qty 1

## 2020-08-31 MED ORDER — NALOXONE HCL 0.4 MG/ML IJ SOLN: 0.4000 mg | INTRAMUSCULAR | Status: DC | PRN

## 2020-08-31 MED ORDER — DIBUCAINE (PERIANAL) 1 % EX OINT: 1.0000 "application " | TOPICAL_OINTMENT | CUTANEOUS | Status: DC | PRN

## 2020-08-31 MED ORDER — DIPHENHYDRAMINE HCL 25 MG PO CAPS: 25.0000 mg | ORAL_CAPSULE | Freq: Four times a day (QID) | ORAL | Status: DC | PRN

## 2020-08-31 MED ORDER — SOD CITRATE-CITRIC ACID 500-334 MG/5ML PO SOLN
ORAL | Status: AC
  Filled 2020-08-31: qty 30

## 2020-08-31 MED ORDER — ROCURONIUM BROMIDE 10 MG/ML (PF) SYRINGE
PREFILLED_SYRINGE | INTRAVENOUS | Status: AC
  Filled 2020-08-31: qty 10

## 2020-08-31 MED ORDER — MEASLES, MUMPS & RUBELLA VAC IJ SOLR: 0.5000 mL | Freq: Once | INTRAMUSCULAR | Status: DC

## 2020-08-31 MED ORDER — PRENATAL MULTIVITAMIN CH
1.0000 | ORAL_TABLET | Freq: Every day | ORAL | Status: DC
  Administered 2020-09-01 – 2020-09-04 (×4): 1 via ORAL
  Filled 2020-08-31 (×4): qty 1

## 2020-08-31 MED ORDER — DEXMEDETOMIDINE (PRECEDEX) IN NS 20 MCG/5ML (4 MCG/ML) IV SYRINGE
PREFILLED_SYRINGE | INTRAVENOUS | Status: AC
  Filled 2020-08-31: qty 5

## 2020-08-31 MED ORDER — CEFAZOLIN SODIUM-DEXTROSE 2-4 GM/100ML-% IV SOLN
2.0000 g | INTRAVENOUS | Status: AC
  Administered 2020-08-31: 2 g via INTRAVENOUS

## 2020-08-31 MED ORDER — PHENYLEPHRINE 40 MCG/ML (10ML) SYRINGE FOR IV PUSH (FOR BLOOD PRESSURE SUPPORT)
PREFILLED_SYRINGE | INTRAVENOUS | Status: DC | PRN
  Administered 2020-08-31: 80 ug via INTRAVENOUS

## 2020-08-31 MED ORDER — PROPOFOL 10 MG/ML IV BOLUS
INTRAVENOUS | Status: AC
  Filled 2020-08-31: qty 20

## 2020-08-31 MED ORDER — DEXAMETHASONE SODIUM PHOSPHATE 10 MG/ML IJ SOLN
INTRAMUSCULAR | Status: DC | PRN
  Administered 2020-08-31: 5 mg via INTRAVENOUS

## 2020-08-31 MED ORDER — NALOXONE HCL 4 MG/10ML IJ SOLN
1.0000 ug/kg/h | INTRAVENOUS | Status: DC | PRN
  Filled 2020-08-31: qty 5

## 2020-08-31 MED ORDER — NALBUPHINE HCL 10 MG/ML IJ SOLN: 5.0000 mg | Freq: Once | INTRAMUSCULAR | Status: DC | PRN

## 2020-08-31 MED ORDER — CEFAZOLIN SODIUM-DEXTROSE 2-4 GM/100ML-% IV SOLN
INTRAVENOUS | Status: AC
  Filled 2020-08-31: qty 100

## 2020-08-31 MED ORDER — SODIUM CHLORIDE 0.9% FLUSH: 3.0000 mL | INTRAVENOUS | Status: DC | PRN

## 2020-08-31 MED ORDER — DEXAMETHASONE SODIUM PHOSPHATE 10 MG/ML IJ SOLN
INTRAMUSCULAR | Status: AC
  Filled 2020-08-31: qty 1

## 2020-08-31 MED ORDER — SENNOSIDES-DOCUSATE SODIUM 8.6-50 MG PO TABS
2.0000 | ORAL_TABLET | ORAL | Status: DC
  Administered 2020-09-01 – 2020-09-03 (×4): 2 via ORAL
  Filled 2020-08-31 (×4): qty 2

## 2020-08-31 MED ORDER — ONDANSETRON HCL 4 MG/2ML IJ SOLN
INTRAMUSCULAR | Status: DC | PRN
  Administered 2020-08-31: 4 mg via INTRAVENOUS

## 2020-08-31 MED ORDER — SIMETHICONE 80 MG PO CHEW
80.0000 mg | CHEWABLE_TABLET | Freq: Three times a day (TID) | ORAL | Status: DC
  Administered 2020-08-31 – 2020-09-04 (×12): 80 mg via ORAL
  Filled 2020-08-31 (×12): qty 1

## 2020-08-31 MED ORDER — DEXMEDETOMIDINE (PRECEDEX) IN NS 20 MCG/5ML (4 MCG/ML) IV SYRINGE
PREFILLED_SYRINGE | INTRAVENOUS | Status: DC | PRN
  Administered 2020-08-31: 80 ug via INTRAVENOUS

## 2020-08-31 MED ORDER — BUPIVACAINE IN DEXTROSE 0.75-8.25 % IT SOLN
INTRATHECAL | Status: DC | PRN
  Administered 2020-08-31: 1.6 mL via INTRATHECAL

## 2020-08-31 MED ORDER — TETANUS-DIPHTH-ACELL PERTUSSIS 5-2.5-18.5 LF-MCG/0.5 IM SUSY: 0.5000 mL | PREFILLED_SYRINGE | Freq: Once | INTRAMUSCULAR | Status: DC

## 2020-08-31 MED ORDER — ARTIFICIAL TEARS OPHTHALMIC OINT
TOPICAL_OINTMENT | OPHTHALMIC | Status: AC
  Filled 2020-08-31: qty 3.5

## 2020-08-31 MED ORDER — ONDANSETRON HCL 4 MG/2ML IJ SOLN: 4.0000 mg | Freq: Three times a day (TID) | INTRAMUSCULAR | Status: DC | PRN

## 2020-08-31 MED ORDER — KETOROLAC TROMETHAMINE 30 MG/ML IJ SOLN: 30.0000 mg | Freq: Four times a day (QID) | INTRAMUSCULAR | Status: DC

## 2020-08-31 MED ORDER — ACETAMINOPHEN 500 MG PO TABS
1000.0000 mg | ORAL_TABLET | Freq: Three times a day (TID) | ORAL | Status: DC
  Administered 2020-08-31 – 2020-09-04 (×13): 1000 mg via ORAL
  Filled 2020-08-31 (×13): qty 2

## 2020-08-31 MED ORDER — TERBUTALINE SULFATE 1 MG/ML IJ SOLN
0.2500 mg | Freq: Once | INTRAMUSCULAR | Status: AC
  Administered 2020-08-31: 0.25 mg via SUBCUTANEOUS

## 2020-08-31 MED ORDER — SCOPOLAMINE 1 MG/3DAYS TD PT72
MEDICATED_PATCH | TRANSDERMAL | Status: AC
  Filled 2020-08-31: qty 1

## 2020-08-31 MED ORDER — ONDANSETRON HCL 4 MG/2ML IJ SOLN
INTRAMUSCULAR | Status: AC
  Filled 2020-08-31: qty 2

## 2020-08-31 MED ORDER — OXYTOCIN-SODIUM CHLORIDE 30-0.9 UT/500ML-% IV SOLN
INTRAVENOUS | Status: DC | PRN
  Administered 2020-08-31: 200 mL via INTRAVENOUS
  Administered 2020-08-31: 300 mL via INTRAVENOUS

## 2020-08-31 MED ORDER — WITCH HAZEL-GLYCERIN EX PADS: 1.0000 "application " | MEDICATED_PAD | CUTANEOUS | Status: DC | PRN

## 2020-08-31 MED ORDER — HYDROMORPHONE HCL 1 MG/ML IJ SOLN: 0.2500 mg | INTRAMUSCULAR | Status: DC | PRN

## 2020-08-31 MED ORDER — NEOSTIGMINE METHYLSULFATE 3 MG/3ML IV SOSY
PREFILLED_SYRINGE | INTRAVENOUS | Status: AC
  Filled 2020-08-31: qty 3

## 2020-08-31 MED ORDER — MORPHINE SULFATE (PF) 0.5 MG/ML IJ SOLN
INTRAMUSCULAR | Status: DC | PRN
  Administered 2020-08-31: 150 ug via INTRATHECAL

## 2020-08-31 MED ORDER — SODIUM CHLORIDE (PF) 0.9 % IJ SOLN
INTRAMUSCULAR | Status: AC
  Filled 2020-08-31: qty 10

## 2020-08-31 MED ORDER — GLYCOPYRROLATE PF 0.2 MG/ML IJ SOSY
PREFILLED_SYRINGE | INTRAMUSCULAR | Status: AC
  Filled 2020-08-31: qty 1

## 2020-08-31 MED ORDER — DIPHENHYDRAMINE HCL 25 MG PO CAPS
25.0000 mg | ORAL_CAPSULE | ORAL | Status: DC | PRN
  Administered 2020-08-31: 25 mg via ORAL
  Filled 2020-08-31: qty 1

## 2020-08-31 MED ORDER — OXYTOCIN-SODIUM CHLORIDE 30-0.9 UT/500ML-% IV SOLN
2.5000 [IU]/h | INTRAVENOUS | Status: AC
  Administered 2020-08-31: 2.5 [IU]/h via INTRAVENOUS

## 2020-08-31 MED ORDER — EPHEDRINE 5 MG/ML INJ
INTRAVENOUS | Status: AC
  Filled 2020-08-31: qty 10

## 2020-08-31 MED ORDER — LIDOCAINE 2% (20 MG/ML) 5 ML SYRINGE
INTRAMUSCULAR | Status: AC
  Filled 2020-08-31: qty 5

## 2020-08-31 MED ORDER — PHENYLEPHRINE 40 MCG/ML (10ML) SYRINGE FOR IV PUSH (FOR BLOOD PRESSURE SUPPORT)
PREFILLED_SYRINGE | INTRAVENOUS | Status: AC
  Filled 2020-08-31: qty 10

## 2020-08-31 MED ORDER — SOD CITRATE-CITRIC ACID 500-334 MG/5ML PO SOLN
30.0000 mL | ORAL | Status: AC
  Administered 2020-08-31: 30 mL via ORAL

## 2020-08-31 SURGICAL SUPPLY — 33 items
BENZOIN TINCTURE PRP APPL 2/3 (GAUZE/BANDAGES/DRESSINGS) IMPLANT
CLOSURE WOUND 1/2 X4 (GAUZE/BANDAGES/DRESSINGS)
CLOTH BEACON ORANGE TIMEOUT ST (SAFETY) ×3 IMPLANT
DRSG OPSITE POSTOP 4X10 (GAUZE/BANDAGES/DRESSINGS) ×3 IMPLANT
ELECT REM PT RETURN 9FT ADLT (ELECTROSURGICAL) ×3
ELECTRODE REM PT RTRN 9FT ADLT (ELECTROSURGICAL) ×1 IMPLANT
EXTRACTOR VACUUM KIWI (MISCELLANEOUS) IMPLANT
GLOVE BIOGEL PI IND STRL 7.0 (GLOVE) ×1 IMPLANT
GLOVE BIOGEL PI INDICATOR 7.0 (GLOVE) ×2
GLOVE SURG ORTHO 8.0 STRL STRW (GLOVE) ×3 IMPLANT
GOWN STRL REUS W/TWL LRG LVL3 (GOWN DISPOSABLE) ×6 IMPLANT
HEMOSTAT ARISTA ABSORB 1G (HEMOSTASIS) ×3 IMPLANT
KIT ABG SYR 3ML LUER SLIP (SYRINGE) IMPLANT
NEEDLE HYPO 25X5/8 SAFETYGLIDE (NEEDLE) IMPLANT
NS IRRIG 1000ML POUR BTL (IV SOLUTION) ×3 IMPLANT
PACK C SECTION WH (CUSTOM PROCEDURE TRAY) ×3 IMPLANT
PAD ABD DERMACEA PRESS 5X9 (GAUZE/BANDAGES/DRESSINGS) ×3 IMPLANT
PAD OB MATERNITY 4.3X12.25 (PERSONAL CARE ITEMS) ×3 IMPLANT
PENCIL SMOKE EVAC W/HOLSTER (ELECTROSURGICAL) ×3 IMPLANT
RTRCTR C-SECT PINK 25CM LRG (MISCELLANEOUS) IMPLANT
STRIP CLOSURE SKIN 1/2X4 (GAUZE/BANDAGES/DRESSINGS) IMPLANT
SUT MNCRL 0 VIOLET CTX 36 (SUTURE) ×1 IMPLANT
SUT MON AB-0 CT1 36 (SUTURE) ×6 IMPLANT
SUT MONOCRYL 0 CTX 36 (SUTURE) ×2
SUT PLAIN 0 NONE (SUTURE) IMPLANT
SUT VIC AB 0 CT1 27 (SUTURE) ×4
SUT VIC AB 0 CT1 27XBRD ANBCTR (SUTURE) ×2 IMPLANT
SUT VIC AB 2-0 CT1 27 (SUTURE) ×2
SUT VIC AB 2-0 CT1 TAPERPNT 27 (SUTURE) ×1 IMPLANT
SUT VIC AB 4-0 KS 27 (SUTURE) ×3 IMPLANT
TOWEL OR 17X24 6PK STRL BLUE (TOWEL DISPOSABLE) ×3 IMPLANT
TRAY FOLEY W/BAG SLVR 14FR LF (SET/KITS/TRAYS/PACK) ×3 IMPLANT
WATER STERILE IRR 1000ML POUR (IV SOLUTION) ×3 IMPLANT

## 2020-08-31 NOTE — Transfer of Care (Signed)
Immediate Anesthesia Transfer of Care Note  Patient: Brandy Mack  Procedure(s) Performed: CESAREAN SECTION (N/A )  Patient Location: PACU  Anesthesia Type:Spinal  Level of Consciousness: awake  Airway & Oxygen Therapy: Patient Spontanous Breathing  Post-op Assessment: Report given to RN  Post vital signs: Reviewed and stable  Last Vitals:  Vitals Value Taken Time  BP    Temp    Pulse    Resp    SpO2      Last Pain:  Vitals:   08/31/20 0806  TempSrc: Oral  PainSc:       Patients Stated Pain Goal: 3 (03/49/61 1643)  Complications: No complications documented.

## 2020-08-31 NOTE — Progress Notes (Signed)
Patient ID: Brandy Mack, female   DOB: 1985-12-03, 34 y.o.   MRN: [id number] After informed verbal consent, Terbutaline 0.25 mg SQ given, ECV was attempted under Ultrasound guidance.  Forward and backward roll attempted x 2.  It was not successful. FHR was reactive before and after the procedure.   Pt. Tolerated the procedure well.

## 2020-08-31 NOTE — Consult Note (Signed)
  Prenatal Consult       08/31/2020  7:58 AM   I was asked by Dr. Harolyn Rutherford to consult on this patient for preterm delivery.  Mom is a 34yo [id number] at 35+[redacted] weeks gestation who was admitted for delivery due to pre-E with severe features.  Planning for a version followed by IOL this morning.  If version is unsuccessful, plan to proceed with C-section.   I explained that most infant's born at [redacted] weeks gestation do very well. We discussed other common problems associated with late prematurity including respiratory distress syndrome, feeding issues, temperature regulation, and hypoglycemia.      We discussed that if their daughter is breathing well at delivery, she will go to the newborn nursery where the staff would follow closely for problems associated with late-prematurity.  If she has respiratory distress at delivery, she would require NICU admission at that time.    Thank you for involving [place] in the care of this patient. A member of our team will be available should the family have additional questions.  Time for consultation approximately 20 minutes.   _____________________ Electronically Signed By: Towana Badger, MD, MS Neonatologist

## 2020-08-31 NOTE — Progress Notes (Signed)
    Faculty Practice OB/GYN Attending Note  Subjective:  No sentinel events overnight. Patient is eager for the ECV this morning.  Patient denies any headaches, visual symptoms, RUQ/epigastric pain or other concerning symptoms. FHR reassuring, no contractions, no LOF or vaginal bleeding. Good FM reported   Admitted on 08/30/2020 for Severe preeclampsia, third trimester.    Objective:  Blood pressure (!) 141/85, pulse 79, temperature 98.1 F (36.7 C), temperature source Oral, resp. rate 16, height 5\' 3"  (1.6 m), weight 109.3 kg, last menstrual period 12/25/2019, SpO2 98 %. FHT  Baseline 115 bpm, moderate variability, +accelerations, no decelerations Toco: every 6-9 minutes Gen: NAD HENT: Normocephalic, atraumatic Lungs: Normal respiratory effort Heart: Regular rate noted Abdomen: NT gravid fundus, soft Cervix: Deferred Ext: 2+ DTRs, no edema, no cyanosis, negative Homan's sign  Labs: CBC Latest Ref Rng & Units 08/31/2020 08/30/2020 07/10/2020  WBC 4.0 - 10.5 K/uL 7.8 7.0 7.9  Hemoglobin 12.0 - 15.0 g/dL 11.1(L) 10.8(L) 11.2  Hematocrit 36 - 46 % 34.3(L) 33.4(L) 33.4(L)  Platelets 150 - 400 K/uL 226 215 210   CMP Latest Ref Rng & Units 08/31/2020 08/30/2020 07/10/2020  Glucose 70 - 99 mg/dL 106(H) 77 199(H)  BUN 6 - 20 mg/dL 8 11 7   Creatinine 0.44 - 1.00 mg/dL 0.65 0.65 0.57  Sodium 135 - 145 mmol/L 134(L) 136 138  Potassium 3.5 - 5.1 mmol/L 4.1 4.2 3.9  Chloride 98 - 111 mmol/L 104 107 107(H)  CO2 22 - 32 mmol/L 16(L) 19(L) 17(L)  Calcium 8.9 - 10.3 mg/dL 7.9(L) 8.9 8.2(L)  Total Protein 6.5 - 8.1 g/dL 6.1(L) 5.8(L) 5.6(L)  Total Bilirubin 0.3 - 1.2 mg/dL 0.7 0.1(L) <0.2  Alkaline Phos 38 - 126 U/L 86 84 62  AST 15 - 41 U/L 21 23 15   ALT 0 - 44 U/L 23 20 19    CBG (last 3)  Recent Labs    08/30/20 1802  GLUCAP 89    Assessment & Plan:  34 y.o. [id number] at [redacted]w[redacted]d admitted for [facility] with SI severe PEC, breech presentation, needing delivery. ECV to be done today, hopefully  followed by IOL. Continue magnesium sulfate for eclampsia prophylaxis and Labetalol; IV Hydralazine as needed. Category 1 FHR tracing. She will be sent to PACU for ECV soon, terbutaline ordered. Will be consented by Dr. Kennon Rounds.    Verita Schneiders, MD, [place] for [employer], [place]

## 2020-08-31 NOTE — Op Note (Signed)
Brandy Mack PROCEDURE DATE: 08/31/2020  PREOPERATIVE DIAGNOSES: Intrauterine pregnancy at [redacted]w[redacted]d weeks gestation; breech presentation, cHTN with superimposed PEC w SF   POSTOPERATIVE DIAGNOSES: The same  PROCEDURE: Primary Low Transverse Cesarean Section  SURGEON:  Dr. Lynnda Shields  ASSISTANT:  Dr. Sharene Skeans   ANESTHESIOLOGY TEAM: No anesthesia staff entered.  INDICATIONS: Brandy Mack is a 34 y.o. [phone] at [redacted]w[redacted]d here for cesarean section secondary to the indications listed under preoperative diagnoses; please see preoperative note for further details.  The risks of surgery were discussed with the patient including but were not limited to: bleeding which may require transfusion or reoperation; infection which may require antibiotics; injury to bowel, bladder, ureters or other surrounding organs; injury to the fetus; need for additional procedures including hysterectomy in the event of a life-threatening hemorrhage; formation of adhesions; placental abnormalities wth subsequent pregnancies; incisional problems; thromboembolic phenomenon and other postoperative/anesthesia complications.  The patient concurred with the proposed plan, giving informed written consent for the procedure.    FINDINGS:  Viable female infant in breech presentation.  Apgars 5 and 9.  Clear amniotic fluid.  Intact placenta, three vessel cord.  Normal uterus, fallopian tubes and ovaries bilaterally.  ANESTHESIA: Spinal   INTRAVENOUS FLUIDS: 358ml   ESTIMATED BLOOD LOSS: 300 ml URINE OUTPUT:  23ml SPECIMENS: Placenta sent to pathology COMPLICATIONS: None immediate  PROCEDURE IN DETAIL:  The patient preoperatively received intravenous antibiotics and had sequential compression devices applied to her lower extremities.  She was then taken to the operating room where spinal anesthesia was administered and was found to be adequate. She was then placed in a dorsal supine position with a leftward tilt, and prepped  and draped in a sterile manner.  A foley catheter was placed into her bladder and attached to constant gravity.  After an adequate timeout was performed, a Pfannenstiel skin incision was made with scalpel and carried through to the underlying layer of fascia. The fascia was incised in the midline, and this incision was extended bilaterally using the Mayo scissors.  Kocher clamps were applied to the superior aspect of the fascial incision and the underlying rectus muscles were dissected off bluntly and sharply.  A similar process was carried out on the inferior aspect of the fascial incision. The rectus muscles were separated in the midline and the peritoneum was entered bluntly. The Alexis self-retaining retractor was introduced into the abdominal cavity.  Attention was turned to the lower uterine segment where a low transverse hysterotomy was made with a scalpel and extended bilaterally bluntly.  The infant was successfully delivered from breech position, the cord was clamped and cut after one minute, and the infant was handed over to the awaiting neonatology team. Uterine massage was then administered, and the placenta delivered intact with a three-vessel cord. The uterus was then cleared of clots and debris.  The hysterotomy was closed with 0 Vicryl in a running locked fashion, and an imbricating layer was also placed with 0 Vicryl.  Two Figure-of-eight 0 Vicryl serosal stitches were placed to help with hemostasis.  The pelvis was cleared of all clot and debris. Hemostasis was confirmed on all surfaces.  The retractor was removed.  The peritoneum was closed with a 0 Vicryl running stitch. The fascia was then closed using 0 Vicryl in a running fashion.  The subcutaneous layer was irrigated, reapproximated with 2-0 plain gut interrupted stitches, and the skin was closed with a 4-0 Vicryl subcuticular stitch. The patient tolerated the procedure well. Sponge, instrument and  needle counts were correct x 3.  She was  taken to the recovery room in stable condition.    Sharene Skeans, MD [facility] Family Medicine Fellow, [facility] for [facility], [place]

## 2020-08-31 NOTE — Progress Notes (Signed)
   PRENATAL VISIT NOTE  Subjective:  Brandy Mack is a 33 y.o. [phone] at [redacted]w[redacted]d being seen today for ongoing prenatal care.  She is currently monitored for the following issues for this high-risk pregnancy and has History of gestational hypertension; Supervision of high risk pregnancy, antepartum; Maternal morbid obesity, antepartum ([place]); Former tobacco use; AMA (advanced maternal age) multigravida 66+, third trimester; BMI 40.0-44.9, adult ([place]); Diet controlled gestational diabetes mellitus in third trimester; Chronic hypertension with superimposed severe preeclampsia; Polyhydramnios in third trimester; Severe preeclampsia, third trimester; and Pre-eclampsia on their problem list.  Patient reports no complaints.  Contractions: Not present. Vag. Bleeding: None.  Movement: Present. Denies leaking of fluid.   The following portions of the patient's history were reviewed and updated as appropriate: allergies, current medications, past family history, past medical history, past social history, past surgical history and problem list.   Objective:   Vitals:   08/30/20 1357 08/30/20 1431  BP: (!) 162/94 (!) 175/100  Pulse: 74     Fetal Status: Fetal Heart Rate (bpm): 137 Fundal Height: 36 cm Movement: Present  Presentation: Undeterminable  General:  Alert, oriented and cooperative. Patient is in no acute distress.  Skin: Skin is warm and dry. No rash noted.   Cardiovascular: Normal heart rate noted  Respiratory: Normal respiratory effort, no problems with respiration noted  Abdomen: Soft, gravid, appropriate for gestational age.  Pain/Pressure: Absent     Pelvic: Cervical exam performed in the presence of a chaperone Dilation: 1 Effacement (%): Thick Station: -2  Extremities: Normal range of motion.  Edema: None  Mental Status: Normal mood and affect. Normal behavior. Normal judgment and thought content.   Assessment and Plan:  Pregnancy: [id number] at [redacted]w[redacted]d 1. Chronic hypertension affecting  pregnancy BP is markedly elevated despite increase to 400 mg bid Labetalol last visit. Will send to MAU for eval. If BP is consistently elevated suggestive of superimposed pre-eclampsia with severe features based on BP, would recommend delivery. Baby might be breech based on location of FHR  2. Diet controlled gestational diabetes mellitus (GDM) in third trimester CBGs are good, see babyscripts data  3. AMA (advanced maternal age) multigravida 40+, unspecified trimester   4. Polyhydramnios in third trimester complication, single or unspecified fetus Likely due to GDM  5. Supervision of high risk pregnancy, antepartum Cultures today - Strep Gp B NAA - GC/Chlamydia probe amp ([place])not at [facility]  Preterm labor symptoms and general obstetric precautions including but not limited to vaginal bleeding, contractions, leaking of fluid and fetal movement were reviewed in detail with the patient. Please refer to After Visit Summary for other counseling recommendations.   Return in 1 week (on 09/06/2020).  No future appointments.  Donnamae Jude, MD

## 2020-08-31 NOTE — Anesthesia Postprocedure Evaluation (Signed)
Anesthesia Post Note  Patient: Shaketta Rill  Procedure(s) Performed: CESAREAN SECTION (N/A )     Patient location during evaluation: PACU Anesthesia Type: Spinal Level of consciousness: awake and alert Pain management: pain level controlled Vital Signs Assessment: post-procedure vital signs reviewed and stable Respiratory status: spontaneous breathing, nonlabored ventilation and respiratory function stable Cardiovascular status: blood pressure returned to baseline and stable Postop Assessment: no apparent nausea or vomiting Anesthetic complications: no   No complications documented.  Last Vitals:  Vitals:   08/31/20 1130 08/31/20 1146  BP: 125/75 126/64  Pulse: 74 (!) 59  Resp: 13 16  Temp:    SpO2: 99% 100%    Last Pain:  Vitals:   08/31/20 1115  TempSrc:   PainSc: 3    Pain Goal: Patients Stated Pain Goal: 3 (08/31/20 0746)              Epidural/Spinal Function Cutaneous sensation: Tingles (08/31/20 1130), Patient able to flex knees: Yes (08/31/20 1130), Patient able to lift hips off bed: No (08/31/20 1130), Back pain beyond tenderness at insertion site: No (08/31/20 1130), Progressively worsening motor and/or sensory loss: No (08/31/20 1130), Bowel and/or bladder incontinence post epidural: No (08/31/20 1130)  Lynda Rainwater

## 2020-08-31 NOTE — H&P (Signed)
Obstetric Preoperative History and Physical  Brandy Mack is a 34 y.o. [id number] with IUP at 91w5dpresenting for  cesarean section after failed version.  The fetus is breech and the mother has known chronic hypertension with superimposed severe preeclampsia.  [facility] at 10.3 [facility] Pregnancy complications or risks:  Breech presentation, severe preeclampsia , gestational diabetes and polyhydramnios. Patient Active Problem List   Diagnosis Date Noted  . Severe preeclampsia, third trimester 08/30/2020  . Pre-eclampsia 08/30/2020  . Polyhydramnios in third trimester 08/07/2020  . Chronic hypertension with superimposed severe preeclampsia 07/24/2020  . Diet controlled gestational diabetes mellitus in third trimester 07/11/2020  . AMA (advanced maternal age) multigravida 386+ third trimester 04/05/2020  . BMI 40.0-44.9, adult ([place] 04/05/2020  . History of gestational hypertension 03/07/2020  . Supervision of high risk pregnancy, antepartum 03/07/2020  . Maternal morbid obesity, antepartum ([place] 03/07/2020  . Former tobacco use 03/07/2020   She plans to breastfeed She is unsure about postpartum contraception.   Prenatal labs and studies: ABO, Rh: --/--/O POS (11/04 1630) Antibody: NEG (11/04 1630) Rubella: 2.66 (05/12 1507) RPR: Non Reactive (09/14 0854)  HBsAg: Negative (05/12 1507)  HIV: Non Reactive (09/14 0856)  GBS: pending 2 hr Glucola  failed Genetic screening normal Anatomy UKoreanormal  Prenatal Transfer Tool  Maternal Diabetes: Yes:  Diabetes Type:  Diet controlled Genetic Screening: Normal Maternal Ultrasounds/Referrals: Other:polyhydramnios Fetal Ultrasounds or other Referrals:  None Maternal Substance Abuse:  No Significant Maternal Medications:  Meds include: Other: labetalol Significant Maternal Lab Results: None  Past Medical History:  Diagnosis Date  . Depression    denies  . Elevated blood pressure   .  Elevated blood pressure reading 05/16/2016  . Gestational diabetes   . Hypertension   . Knee pain, acute 05/16/2016  . Positive urine pregnancy test 02/07/2020    Past Surgical History:  Procedure Laterality Date  . NO PAST SURGERIES      OB History  Gravida Para Term Preterm AB Living  '4 2 2   1 2  ' SAB TAB Ectopic Multiple Live Births          2    # Outcome Date GA Lbr Len/2nd Weight Sex Delivery Anes PTL Lv  4 Current           3 AB 04/27/19    U      2 Term 09/13/08    M Vag-Spont     1 Term 09/10/01   3856 g  Vag-Spont   LIV    Social History   Socioeconomic History  . Marital status: Single    Spouse name: Not on file  . Number of children: Not on file  . Years of education: Not on file  . Highest education level: Not on file  Occupational History  . Not on file  Tobacco Use  . Smoking status: Former Smoker    Packs/day: 0.25    Years: 10.00    Pack years: 2.50    Types: Cigarettes    Quit date: 01/26/2020    Years since quitting: 0.5  . Smokeless tobacco: Never Used  Vaping Use  . Vaping Use: Never used  Substance and Sexual Activity  . Alcohol use: Not Currently    Alcohol/week: 0.0 standard drinks    Comment: social  . Drug use: No  . Sexual activity: Yes  Other Topics Concern  . Not on file  Social History Narrative  Single.   2 children.   Works at [employer].   Enjoys swimming, reading, spending time with her family.   Social Determinants of Health   Financial Resource Strain:   . Difficulty of Paying Living Expenses: Not on file  Food Insecurity:   . Worried About Charity fundraiser in the Last Year: Not on file  . Ran Out of Food in the Last Year: Not on file  Transportation Needs:   . Lack of Transportation (Medical): Not on file  . Lack of Transportation (Non-Medical): Not on file  Physical Activity:   . Days of Exercise per Week: Not on file  . Minutes of Exercise per Session: Not on file  Stress:   . Feeling of Stress : Not on  file  Social Connections:   . Frequency of Communication with Friends and Family: Not on file  . Frequency of Social Gatherings with Friends and Family: Not on file  . Attends Religious Services: Not on file  . Active Member of Clubs or Organizations: Not on file  . Attends Archivist Meetings: Not on file  . Marital Status: Not on file    Family History  Adopted: Yes  Problem Relation Age of Onset  . Hypertension Father   . Stroke Father   . Hyperlipidemia Mother     Medications Prior to Admission  Medication Sig Dispense Refill Last Dose  . aspirin EC 81 MG tablet Take 1 tablet (81 mg total) by mouth daily. Take after 12 weeks for prevention of preeclampsia later in pregnancy 300 tablet 2 08/30/2020 at 0700  . labetalol (NORMODYNE) 200 MG tablet Take 2 tablets (400 mg total) by mouth 2 (two) times daily. 120 tablet 5 08/30/2020 at 0700  . Prenatal Vit-Fe Fumarate-FA (MULTIVITAMIN-PRENATAL) 27-0.8 MG TABS tablet Take 1 tablet by mouth daily at 12 noon.   08/29/2020 at Unknown time  . Accu-Chek Softclix Lancets lancets 1 each by Other route 4 (four) times daily. 100 each 12   . Blood Glucose Monitoring Suppl (ACCU-CHEK GUIDE) w/Device KIT 1 Device by Does not apply route 4 (four) times daily. 1 kit 0   . glucose blood (ACCU-CHEK GUIDE) test strip Use to check blood sugars four times a day was instructed 50 each 12     Allergies  Allergen Reactions  . Naproxen Itching and Swelling    Throat swells    Review of Systems: Negative except for what is mentioned in HPI.  Physical Exam: BP (!) 149/91 (BP Location: Right Arm)   Pulse 76   Temp 98.5 F (36.9 C) (Oral)   Resp 18   Ht '5\' 3"'  (1.6 m)   Wt 109.3 kg   LMP 12/25/2019   SpO2 98%   BMI 42.69 kg/m  FHR by Doppler: reactive CONSTITUTIONAL: Well-developed, well-nourished female in no acute distress.  HENT:  Normocephalic, atraumatic. Oropharynx is clear and moist EYES: Conjunctivae and EOM are normal. No scleral  icterus.  NECK: Normal range of motion, supple SKIN: Skin is warm and dry. No rash noted. Not diaphoretic. No erythema. [place]: Alert and oriented to person, place, and time. Normal reflexes, muscle tone coordination. No cranial nerve deficit noted. PSYCHIATRIC: Normal mood and affect. Normal behavior. CARDIOVASCULAR: Normal heart rate noted, regular rhythm RESPIRATORY: Effort and breath sounds normal, no problems with respiration noted ABDOMEN: Soft, nontender, nondistended, gravid. Obese PELVIC: Deferred MUSCULOSKELETAL: Normal range of motion. No edema and no tenderness. 2+ distal pulses.   Pertinent Labs/Studies:   Results for  orders placed or performed during the hospital encounter of 08/30/20 (from the past 72 hour(s))  Protein / creatinine ratio, urine     Status: None   Collection Time: 08/30/20  4:13 PM  Result Value Ref Range   Creatinine, Urine 23.78 mg/dL   Total Protein, Urine <6 mg/dL   Protein Creatinine Ratio        0.00 - 0.15 mg/mg[Cre]    Comment: RESULT BELOW REPORTABLE RANGE, UNABLE TO CALCULATE. Performed at [facility], [place] [address], [place], [place] [address]   Type and screen [place]     Status: None   Collection Time: 08/30/20  4:30 PM  Result Value Ref Range   ABO/RH(D) O POS    Antibody Screen NEG    Sample Expiration      09/02/2020,2359 Performed at [facility], [place] [address] [place], [place] [address]   CBC     Status: Abnormal   Collection Time: 08/30/20  4:37 PM  Result Value Ref Range   WBC 7.0 4.0 - 10.5 K/uL   RBC 4.15 3.87 - 5.11 MIL/uL   Hemoglobin 10.8 (L) 12.0 - 15.0 g/dL   HCT 33.4 (L) 36 - 46 %   MCV 80.5 80.0 - 100.0 fL   MCH 26.0 26.0 - 34.0 pg   MCHC 32.3 30.0 - 36.0 g/dL   RDW 13.1 11.5 - 15.5 %   Platelets 215 150 - 400 K/uL   nRBC 0.0 0.0 - 0.2 %    Comment: Performed at [facility], [place] [address] [place], [place] [address]  Comprehensive metabolic panel     Status:  Abnormal   Collection Time: 08/30/20  4:37 PM  Result Value Ref Range   Sodium 136 135 - 145 mmol/L   Potassium 4.2 3.5 - 5.1 mmol/L   Chloride 107 98 - 111 mmol/L   CO2 19 (L) 22 - 32 mmol/L   Glucose, Bld 77 70 - 99 mg/dL    Comment: Glucose reference range applies only to samples taken after fasting for at least 8 hours.   BUN 11 6 - 20 mg/dL   Creatinine, Ser 0.65 0.44 - 1.00 mg/dL   Calcium 8.9 8.9 - 10.3 mg/dL   Total Protein 5.8 (L) 6.5 - 8.1 g/dL   Albumin 2.8 (L) 3.5 - 5.0 g/dL   AST 23 15 - 41 U/L   ALT 20 0 - 44 U/L   Alkaline Phosphatase 84 38 - 126 U/L   Total Bilirubin 0.1 (L) 0.3 - 1.2 mg/dL   GFR, Estimated >60 >60 mL/min    Comment: (NOTE) Calculated using the CKD-EPI Creatinine Equation (2021)    Anion gap 10 5 - 15    Comment: Performed at [facility], [place] [address], [place], [place] [address]  Respiratory Panel by RT PCR (Flu A&B, Covid) - Nasopharyngeal Swab     Status: None   Collection Time: 08/30/20  5:21 PM   Specimen: Nasopharyngeal Swab  Result Value Ref Range   SARS Coronavirus 2 by RT PCR NEGATIVE NEGATIVE    Comment: (NOTE) SARS-CoV-2 target nucleic acids are NOT DETECTED.  The SARS-CoV-2 RNA is generally detectable in upper respiratoy specimens during the acute phase of infection. The lowest concentration of SARS-CoV-2 viral copies this assay can detect is 131 copies/mL. A negative result does not preclude SARS-Cov-2 infection and should not be used as the sole basis for treatment or other patient management decisions. A  negative result may occur with  improper specimen collection/handling, submission of specimen other than nasopharyngeal swab, presence of viral mutation(s) within the areas targeted by this assay, and inadequate number of viral copies (<131 copies/mL). A negative result must be combined with clinical observations, patient history, and epidemiological information. The expected result is Negative.  Fact Sheet for  Patients:  PinkCheek.be  Fact Sheet for Healthcare Providers:  GravelBags.it  This test is no t yet approved or cleared by the [place] FDA and  has been authorized for detection and/or diagnosis of SARS-CoV-2 by FDA under an Emergency Use Authorization (EUA). This EUA will remain  in effect (meaning this test can be used) for the duration of the COVID-19 declaration under Section 564(b)(1) of the Act, 21 U.S.C. section 360bbb-3(b)(1), unless the authorization is terminated or revoked sooner.     Influenza A by PCR NEGATIVE NEGATIVE   Influenza B by PCR NEGATIVE NEGATIVE    Comment: (NOTE) The Xpert Xpress SARS-CoV-2/FLU/RSV assay is intended as an aid in  the diagnosis of influenza from Nasopharyngeal swab specimens and  should not be used as a sole basis for treatment. Nasal washings and  aspirates are unacceptable for Xpert Xpress SARS-CoV-2/FLU/RSV  testing.  Fact Sheet for Patients: PinkCheek.be  Fact Sheet for Healthcare Providers: GravelBags.it  This test is not yet approved or cleared by the [place] FDA and  has been authorized for detection and/or diagnosis of SARS-CoV-2 by  FDA under an Emergency Use Authorization (EUA). This EUA will remain  in effect (meaning this test can be used) for the duration of the  Covid-19 declaration under Section 564(b)(1) of the Act, 21  U.S.C. section 360bbb-3(b)(1), unless the authorization is  terminated or revoked. Performed at [facility], [place] [address] [place], [place] [address]   Glucose, capillary     Status: None   Collection Time: 08/30/20  6:02 PM  Result Value Ref Range   Glucose-Capillary 89 70 - 99 mg/dL    Comment: Glucose reference range applies only to samples taken after fasting for at least 8 hours.  Comprehensive metabolic panel     Status: Abnormal   Collection Time: 08/31/20   5:43 AM  Result Value Ref Range   Sodium 134 (L) 135 - 145 mmol/L   Potassium 4.1 3.5 - 5.1 mmol/L   Chloride 104 98 - 111 mmol/L   CO2 16 (L) 22 - 32 mmol/L   Glucose, Bld 106 (H) 70 - 99 mg/dL    Comment: Glucose reference range applies only to samples taken after fasting for at least 8 hours.   BUN 8 6 - 20 mg/dL   Creatinine, Ser 0.65 0.44 - 1.00 mg/dL   Calcium 7.9 (L) 8.9 - 10.3 mg/dL   Total Protein 6.1 (L) 6.5 - 8.1 g/dL   Albumin 2.9 (L) 3.5 - 5.0 g/dL   AST 21 15 - 41 U/L   ALT 23 0 - 44 U/L   Alkaline Phosphatase 86 38 - 126 U/L   Total Bilirubin 0.7 0.3 - 1.2 mg/dL   GFR, Estimated >60 >60 mL/min    Comment: (NOTE) Calculated using the CKD-EPI Creatinine Equation (2021)    Anion gap 14 5 - 15    Comment: Performed at [facility], [place] [address] [place], [place] [address]  CBC     Status: Abnormal   Collection Time: 08/31/20  5:43 AM  Result Value Ref Range   WBC 7.8 4.0 - 10.5 K/uL  RBC 4.21 3.87 - 5.11 MIL/uL   Hemoglobin 11.1 (L) 12.0 - 15.0 g/dL   HCT 34.3 (L) 36 - 46 %   MCV 81.5 80.0 - 100.0 fL   MCH 26.4 26.0 - 34.0 pg   MCHC 32.4 30.0 - 36.0 g/dL   RDW 13.2 11.5 - 15.5 %   Platelets 226 150 - 400 K/uL   nRBC 0.0 0.0 - 0.2 %    Comment: Performed at [place] [address] [place], [place] [address]  Glucose, capillary     Status: Abnormal   Collection Time: 08/31/20  8:06 AM  Result Value Ref Range   Glucose-Capillary 101 (H) 70 - 99 mg/dL    Comment: Glucose reference range applies only to samples taken after fasting for at least 8 hours.    Assessment and Plan :Hiilei Gerst is a 34 y.o. [id number] at 35w5dbeing admitted for scheduled cesarean section. The risks of cesarean section discussed with the patient included but were not limited to: bleeding which may require transfusion or reoperation; infection which may require antibiotics; injury to bowel, bladder, ureters or other surrounding organs; injury to the fetus; need for  additional procedures including hysterectomy in the event of a life-threatening hemorrhage; placental abnormalities wth subsequent pregnancies, incisional problems, thromboembolic phenomenon and other postoperative/anesthesia complications. The patient concurred with the proposed plan, giving informed written consent for the procedure. Patient has been NPO since last night she will remain NPO for procedure. Anesthesia and OR aware. Preoperative prophylactic antibiotics and SCDs ordered on call to the OR. To OR when ready.    LLynnda Shields MD Faculty attending, [facility]11/02/2020, 8:51 AM

## 2020-08-31 NOTE — Anesthesia Preprocedure Evaluation (Signed)
Anesthesia Evaluation  Patient identified by MRN, date of birth, ID band Patient awake    Reviewed: Allergy & Precautions, NPO status , Patient's Chart, lab work & pertinent test results  Airway Mallampati: II  TM Distance: >3 FB Neck ROM: Full    Dental no notable dental hx.    Pulmonary neg pulmonary ROS, former smoker,    Pulmonary exam normal breath sounds clear to auscultation       Cardiovascular hypertension, Pt. on medications negative cardio ROS Normal cardiovascular exam Rhythm:Regular Rate:Normal     Neuro/Psych Depression negative neurological ROS  negative psych ROS   GI/Hepatic negative GI ROS, Neg liver ROS,   Endo/Other  negative endocrine ROSdiabetes, Gestational  Renal/GU negative Renal ROS  negative genitourinary   Musculoskeletal negative musculoskeletal ROS (+)   Abdominal (+) + obese,   Peds negative pediatric ROS (+)  Hematology negative hematology ROS (+)   Anesthesia Other Findings   Reproductive/Obstetrics (+) Pregnancy                             Anesthesia Physical Anesthesia Plan  ASA: II  Anesthesia Plan: Spinal   Post-op Pain Management:    Induction:   PONV Risk Score and Plan: 2 and Treatment may vary due to age or medical condition  Airway Management Planned: Natural Airway  Additional Equipment:   Intra-op Plan:   Post-operative Plan:   Informed Consent: I have reviewed the patients History and Physical, chart, labs and discussed the procedure including the risks, benefits and alternatives for the proposed anesthesia with the patient or authorized representative who has indicated his/her understanding and acceptance.     Dental advisory given  Plan Discussed with: CRNA  Anesthesia Plan Comments:         Anesthesia Quick Evaluation

## 2020-08-31 NOTE — Anesthesia Procedure Notes (Signed)
Spinal  Patient location during procedure: OB Start time: 08/31/2020 9:08 AM End time: 08/31/2020 9:12 AM Staffing Performed: anesthesiologist  Anesthesiologist: Lynda Rainwater, MD Preanesthetic Checklist Completed: patient identified, IV checked, risks and benefits discussed, surgical consent, monitors and equipment checked, pre-op evaluation and timeout performed Spinal Block Patient position: sitting Prep: DuraPrep and site prepped and draped Patient monitoring: heart rate, cardiac monitor, continuous pulse ox and blood pressure Approach: midline Location: L3-4 Injection technique: single-shot Needle Needle type: Pencan  Needle gauge: 24 G Needle length: 10 cm Assessment Sensory level: T4

## 2020-08-31 NOTE — Lactation Note (Signed)
This note was copied from a baby's chart. Lactation Consultation Note  Patient Name: Brandy Mack SUPJS'R Date: 08/31/2020 Reason for consult: Initial assessment;NICU baby;Infant < 6lbs;Late-preterm 34-36.6wks  MgSO4/C-Section  LC in to visit with P3 Mom of LPTI in the NICU.  Baby 5 hrs old.  Mom is an experienced breastfeeder but it's been 69 and 19 yrs since she breastfed.    Reviewed breast massage and hand expression.  Drops of colostrum easily expressed into colostrum container.    Set up DEBP and assisted Mom with first pumping.  Mom fitted with 24 mm flanges.  Demonstrated how to set pump to initiation setting.    Mom encouraged to ask for baby to be STS when visiting in NICU.  Lactation brochure and NICU booklet provided. Mom aware of IP and OP lactation support available to Mom.    Interventions Interventions: Breast feeding basics reviewed;Skin to skin;Breast massage;Hand express;DEBP  Lactation Tools Discussed/Used Tools: Pump;Flanges Flange Size: 24 Breast pump type: Double-Electric Breast Pump WIC Program: No Pump Review: Setup, frequency, and cleaning;Milk Storage Initiated by:: Cipriano Mile RN IBCLC Date initiated:: 08/31/20   Consult Status Consult Status: Follow-up Date: 09/01/20 Follow-up type: In-patient    [place], [place] 08/31/2020, 2:50 PM

## 2020-08-31 NOTE — Discharge Summary (Signed)
Postpartum Discharge Summary  Date of Service updated 09/04/20     Patient Name: Brandy Mack DOB: 09-21-1986 MRN: [id number]  Date of admission: 08/30/2020 Delivery date:08/31/2020  Delivering provider: Griffin Basil  Date of discharge: 09/04/2020  Admitting diagnosis: Pre-eclampsia [O14.90] Intrauterine pregnancy: [redacted]w[redacted]d    Secondary diagnosis:  Principal Problem:   Severe preeclampsia, third trimester Active Problems:   Supervision of high risk pregnancy, antepartum   Maternal morbid obesity, antepartum ([place]   AMA (advanced maternal age) multigravida 35+, third trimester   Diet controlled gestational diabetes mellitus in third trimester   Chronic hypertension with superimposed severe preeclampsia   Polyhydramnios in third trimester   Pre-eclampsia  Additional problems:     Discharge diagnosis: Term Pregnancy Delivered and Preeclampsia (severe), cHTN, a1GDM                                Post partum procedures:magnesium Augmentation: N/A Complications: None  Hospital course: Sceduled C/S   34y.o. yo [id number] 354w5das admitted to the hospital 08/30/2020 for scheduled cesarean section with the following indication:Malpresentation s/p failed ECV. CHTN w SIPEC w SF.   Delivery details are as follows:  Membrane Rupture Time/Date: 9:39 AM ,08/31/2020   Delivery Method:C-Section, Low Transverse  Details of operation can be found in separate operative note.  Pt received magnesium x 24 hours. Antihypertensive medications were start for BP control. BP at time of discharge was 130-140's 70-90's. She otherwise progressed to ambulating, tolerating a regular diet, passing flatus, and urinating well. Felt amendable for discharge home on POD # 4. Discharge instructions, medications, and follow up reviewed with the pt. She verbalized understanding Patient is discharged home in stable condition on  09/04/20        Newborn Data: Birth date:08/31/2020  Birth time:9:40 AM   Gender:Female  Living status:Living  Apgars:5 ,9  Weight:2450 g     Magnesium Sulfate received: Yes: Seizure prophylaxis BMZ received: Yes Rhophylac:N/A MMR:N/A T-DaP:Given prenatally Flu: Yes Transfusion:No  Physical exam  Vitals:   09/03/20 1924 09/03/20 2344 09/04/20 0406 09/04/20 0740  BP: (!) 153/93 137/69 (!) 148/84 (!) 145/96  Pulse: (!) 110 96 98 86  Resp: '18 18 18 18  ' Temp: 97.9 F (36.6 C) 98.3 F (36.8 C) 98.2 F (36.8 C) 98 F (36.7 C)  TempSrc: Oral Oral Oral Oral  SpO2: 99% 99% 99% 99%  Weight:      Height:       General: alert Lochia: appropriate Uterine Fundus: firm Incision: Healing well with no significant drainage DVT Evaluation: No evidence of DVT seen on physical exam. Labs: Lab Results  Component Value Date   WBC 9.8 09/01/2020   HGB 10.2 (L) 09/01/2020   HCT 31.3 (L) 09/01/2020   MCV 82.4 09/01/2020   PLT 221 09/01/2020   CMP Latest Ref Rng & Units 09/01/2020  Glucose 70 - 99 mg/dL 132(H)  BUN 6 - 20 mg/dL 7  Creatinine 0.44 - 1.00 mg/dL 0.76  Sodium 135 - 145 mmol/L 136  Potassium 3.5 - 5.1 mmol/L 4.1  Chloride 98 - 111 mmol/L 105  CO2 22 - 32 mmol/L 23  Calcium 8.9 - 10.3 mg/dL 7.0(L)  Total Protein 6.5 - 8.1 g/dL 5.4(L)  Total Bilirubin 0.3 - 1.2 mg/dL 0.3  Alkaline Phos 38 - 126 U/L 72  AST 15 - 41 U/L 26  ALT 0 - 44 U/L 22   Edinburgh  Score: Edinburgh Postnatal Depression Scale Screening Tool 09/01/2020  I have been able to laugh and see the funny side of things. 0  I have looked forward with enjoyment to things. 0  I have blamed myself unnecessarily when things went wrong. 2  I have been anxious or worried for no good reason. 2  I have felt scared or panicky for no good reason. 2  Things have been getting on top of me. 0  I have been so unhappy that I have had difficulty sleeping. 1  I have felt sad or miserable. 0  I have been so unhappy that I have been crying. 1  The thought of harming myself has occurred to me. 0   Edinburgh Postnatal Depression Scale Total 8     After visit meds:  Allergies as of 09/04/2020      Reactions   Naproxen Itching, Swelling   Throat swells   Other Hives, Swelling   Melons cause hives and throat swelling      Medication List    STOP taking these medications   Accu-Chek Guide test strip Generic drug: glucose blood   Accu-Chek Guide w/Device Kit   Accu-Chek Softclix Lancets lancets   labetalol 200 MG tablet Commonly known as: NORMODYNE     TAKE these medications   aspirin EC 81 MG tablet Take 1 tablet (81 mg total) by mouth daily. Take after 12 weeks for prevention of preeclampsia later in pregnancy   hydrochlorothiazide 25 MG tablet Commonly known as: HYDRODIURIL Take 1 tablet (25 mg total) by mouth daily.   multivitamin-prenatal 27-0.8 MG Tabs tablet Take 1 tablet by mouth daily.   NIFEdipine 60 MG 24 hr tablet Commonly known as: ADALAT CC Take 1 tablet (60 mg total) by mouth daily.   oxyCODONE 5 MG immediate release tablet Commonly known as: Oxy IR/ROXICODONE Take 1 tablet (5 mg total) by mouth every 6 (six) hours as needed for moderate pain.            Discharge Care Instructions  (From admission, onward)         Start     Ordered   09/04/20 0000  Discharge wound care:       Comments: Remove dressing Sunday September 09, 2020   09/04/20 4098           Discharge home in stable condition Infant Feeding: Breast Infant Disposition:home with mother Discharge instruction: per After Visit Summary and Postpartum booklet. Activity: Advance as tolerated. Pelvic rest for 6 weeks.  Diet: routine diet Future Appointments: Future Appointments  Date Time Provider [place]  09/07/2020  9:30 AM [facility] NURSE [facility]  10/10/2020  8:30 AM Caren Macadam, MD [facility]   Follow up Visit:  [place] [facility] [place] [facility] Follow up.   Specialty: Obstetrics and  Gynecology Contact information: [address] [place] [phone]               Please schedule this patient for a In person postpartum visit in 6 weeks with the following provider: MD. Additional Postpartum F/U:2 hour GTT at six week visit, Incision check 1 week and BP check 1 week  High risk pregnancy complicated by: GDM and HTN Delivery mode:  C-Section, Low Transverse  Anticipated Birth Control:  Unsure   09/04/2020 Chancy Milroy, MD

## 2020-09-01 LAB — CBC
HCT: 31.3 % — ABNORMAL LOW (ref 36.0–46.0)
Hemoglobin: 10.2 g/dL — ABNORMAL LOW (ref 12.0–15.0)
MCH: 26.8 pg (ref 26.0–34.0)
MCHC: 32.6 g/dL (ref 30.0–36.0)
MCV: 82.4 fL (ref 80.0–100.0)
Platelets: 221 10*3/uL (ref 150–400)
RBC: 3.8 MIL/uL — ABNORMAL LOW (ref 3.87–5.11)
RDW: 13.4 % (ref 11.5–15.5)
WBC: 9.8 10*3/uL (ref 4.0–10.5)
nRBC: 0 % (ref 0.0–0.2)

## 2020-09-01 LAB — COMPREHENSIVE METABOLIC PANEL
ALT: 22 U/L (ref 0–44)
AST: 26 U/L (ref 15–41)
Albumin: 2.6 g/dL — ABNORMAL LOW (ref 3.5–5.0)
Alkaline Phosphatase: 72 U/L (ref 38–126)
Anion gap: 8 (ref 5–15)
BUN: 7 mg/dL (ref 6–20)
CO2: 23 mmol/L (ref 22–32)
Calcium: 7 mg/dL — ABNORMAL LOW (ref 8.9–10.3)
Chloride: 105 mmol/L (ref 98–111)
Creatinine, Ser: 0.76 mg/dL (ref 0.44–1.00)
GFR, Estimated: >60 mL/min (ref >60)
Glucose, Bld: 132 mg/dL — ABNORMAL HIGH (ref 70–99)
Potassium: 4.1 mmol/L (ref 3.5–5.1)
Sodium: 136 mmol/L (ref 135–145)
Total Bilirubin: 0.3 mg/dL (ref 0.3–1.2)
Total Protein: 5.4 g/dL — ABNORMAL LOW (ref 6.5–8.1)

## 2020-09-01 LAB — STREP GP B NAA: Strep Gp B NAA: POSITIVE — AB

## 2020-09-01 NOTE — Progress Notes (Signed)
Faculty Attending Note  Post Op Day 1  Subjective: Patient is feeling well. She reports moderately well controlled pain on PO pain meds. She is ambulating and denies light-headedness or dizziness. She is voiding spontaneously. She is  passing flatus, has not had a BM yet. She is tolerating a regular diet without nausea/vomiting. Bleeding is moderate. She is breast feeding. Baby is in room and doing well.  Objective: Blood pressure 129/81, pulse 68, temperature 97.7 F (36.5 C), temperature source Oral, resp. rate 18, height 5\' 3"  (1.6 m), weight 109.3 kg, last menstrual period 12/25/2019, SpO2 98 %, unknown if currently breastfeeding. Temp:  [97.1 F (36.2 C)-98.5 F (36.9 C)] 97.7 F (36.5 C) (11/06 0400) Pulse Rate:  [56-79] 68 (11/06 0400) Resp:  [13-20] 18 (11/06 0400) BP: (118-160)/(64-106) 129/81 (11/06 0400) SpO2:  [95 %-100 %] 98 % (11/06 0400)  Physical Exam:  General: alert, oriented, cooperative Chest: normal respiratory effort Heart: RRR  Abdomen: soft, appropriately tender to palpation, incision covered by dressing with no evidence of active bleeding  Uterine Fundus: firm, at level of the umbilicus Lochia: moderate, rubra DVT Evaluation: no evidence of DVT Extremities: no edema, no calf tenderness  UOP: voiding spontaneously  Recent Labs    08/31/20 0543 09/01/20 0508  HGB 11.1* 10.2*  HCT 34.3* 31.3*    Assessment/Plan: Patient Active Problem List   Diagnosis Date Noted  . Severe preeclampsia, third trimester 08/30/2020  . Pre-eclampsia 08/30/2020  . Polyhydramnios in third trimester 08/07/2020  . Chronic hypertension with superimposed severe preeclampsia 07/24/2020  . Diet controlled gestational diabetes mellitus in third trimester 07/11/2020  . AMA (advanced maternal age) multigravida 79+, third trimester 04/05/2020  . BMI 40.0-44.9, adult ([place]) 04/05/2020  . History of gestational hypertension 03/07/2020  . Supervision of high risk pregnancy,  antepartum 03/07/2020  . Maternal morbid obesity, antepartum ([place]) 03/07/2020  . Former tobacco use 03/07/2020    Patient is 34 y.o. [id number] POD#2 s/p 1LTCS at [redacted]w[redacted]d for breech, chronic hypertension with superimposed severe pre-eclampsia. She is doing well, recovering appropriately and complains only of moderate pain.   Mag off at 10:30 this am Continue routine post partum care Pain meds prn Regular diet Lovenox 40 mg daily [place] undecided for birth control Plan for discharge likely tomorrow Will need 2 hr post partum GTT for gDMA1    Feliz Beam, M.D. Attending Center for [employer] (Faculty Practice)  09/01/2020, 7:48 AM

## 2020-09-01 NOTE — Lactation Note (Signed)
This note was copied from a baby's chart. Lactation Consultation Note  Patient Name: Brandy Mack ZCHYI'F Date: 09/01/2020 Reason for consult: Follow-up assessment;NICU baby;Late-preterm 34-36.6wks;Infant < 6lbs;Infant weight loss  Visited with mom of a 72 hours old LPI NICU female < 6 lbs; she's a P3 and experienced BF but her kids are older now at 37 and 35 years old. Mom told LC that she tried pumping last night and this morning and only got "drops" but she did take them to the NICU.  Explained to mom that the purpose of pumping early on is mainly for breast stimulation and not to get volume. She denied any pain/discomfort during pumping sessions, advised mom to massage her breast and do some hand expression prior pumping to facilitate MER.  Baby is in the NICU and at 2% weight loss. This consult took place in the NICU, [place] attempted to see mom in her room on the first floor first but she wasn't there.   Feeding plan:  1. Encouraged mom to continue pumping every 3 hours, at least 8 pumping sessions/24 hours 2. She'll continue turning in her EBM to the NICU  No other support person in baby's room other than mom. She reported all questions and concerns were answered, she's aware of [place] [facility] OP services and will call PRN.   Maternal Data    Feeding    LATCH Score                   Interventions Interventions: Breast feeding basics reviewed;DEBP  Lactation Tools Discussed/Used Tools: Pump;Flanges Flange Size: 24 Breast pump type: Double-Electric Breast Pump   Consult Status Consult Status: Follow-up Date: 09/02/20 Follow-up type: In-patient    Brandy Mack 09/01/2020, 11:53 AM

## 2020-09-02 MED ORDER — NIFEDIPINE ER OSMOTIC RELEASE 30 MG PO TB24
30.0000 mg | ORAL_TABLET | Freq: Every day | ORAL | Status: DC
  Administered 2020-09-02: 30 mg via ORAL
  Filled 2020-09-02: qty 1

## 2020-09-02 MED ORDER — HYDROCHLOROTHIAZIDE 25 MG PO TABS
25.0000 mg | ORAL_TABLET | Freq: Every day | ORAL | Status: DC
  Administered 2020-09-02 – 2020-09-04 (×3): 25 mg via ORAL
  Filled 2020-09-02 (×3): qty 1

## 2020-09-02 NOTE — Progress Notes (Signed)
Patient passed golf ball sized clot around 0100. RN performed fundal assessment (see flowsheet) and noted two bruises on abdomen where patient stated 6/10 pain originated. RN palpated hard masses under bruises. Dr. Roselie Awkward notified @0125  and will assess in morning.

## 2020-09-02 NOTE — Lactation Note (Signed)
This note was copied from a baby's chart. Lactation Consultation Note  Patient Name: Brandy Mack YKDXI'P Date: 09/02/2020 Reason for consult: Follow-up assessment;NICU baby;Infant < 6lbs;Late-preterm 34-36.6wks  LC in to visit with P3 Mom of LPTI in the NICU.  Baby on CPAP at 70 hrs old and being exclusively gavage fed donor/expressed breast milk.  Breasts filling, not engorged.  Mom has been consistently double pumping and volume increasing to 2 oz per pumping now.  Labeling EBM and transporting to NICU for baby.  Mom encouraged to continue pumping on maintenance setting every 2-3 hrs during the day and 3-4 hrs at night.  Mom knows she can take pump parts to NICU and pump at baby's bedside.  Encouraged Mom to ask about holding baby STS today.    Mom has a Spectra DEBP at home.  Mom denies any questions currently.  Interventions Interventions: Breast feeding basics reviewed;Skin to skin;Breast massage;Hand express;DEBP  Lactation Tools Discussed/Used Tools: Pump;Flanges Flange Size: 24 Breast pump type: Double-Electric Breast Pump   Consult Status Consult Status: Follow-up Date: 09/03/20 Follow-up type: In-patient    Brandy Mack, Brandy Mack 09/02/2020, 8:21 AM

## 2020-09-02 NOTE — Progress Notes (Signed)
Subjective:incision pain Postpartum Day 2: Cesarean Delivery Patient reports incisional pain, tolerating PO and no problems voiding.    Objective: Vital signs in last 24 hours: Temp:  [97.7 F (36.5 C)-98.5 F (36.9 C)] 98 F (36.7 C) (11/07 0724) Pulse Rate:  [57-72] 68 (11/07 0724) Resp:  [18-21] 20 (11/07 0724) BP: (137-166)/(74-86) 166/86 (11/07 0724) SpO2:  [98 %-99 %] 99 % (11/07 0724)  Physical Exam:  General: alert, cooperative and no distress Lochia: appropriate Uterine Fundus: firm Incision: healing well, no significant drainage DVT Evaluation: No evidence of DVT seen on physical exam.  Recent Labs    08/31/20 0543 09/01/20 0508  HGB 11.1* 10.2*  HCT 34.3* 31.3*    Assessment/Plan: Status post Cesarean section. Postoperative course complicated by hypertension, baby in NICU on CPAP  Continue current care. Added HCTZ, continue procardia  Emeterio Reeve 09/02/2020, 7:29 AM

## 2020-09-03 DIAGNOSIS — O115 Pre-existing hypertension with pre-eclampsia, complicating the puerperium: Secondary | ICD-10-CM

## 2020-09-03 DIAGNOSIS — O1093 Unspecified pre-existing hypertension complicating the puerperium: Secondary | ICD-10-CM

## 2020-09-03 LAB — SURGICAL PATHOLOGY

## 2020-09-03 MED ORDER — LABETALOL HCL 5 MG/ML IV SOLN
20.0000 mg | INTRAVENOUS | Status: DC | PRN
  Administered 2020-09-03: 20 mg via INTRAVENOUS
  Filled 2020-09-03: qty 4

## 2020-09-03 MED ORDER — HYDRALAZINE HCL 20 MG/ML IJ SOLN
5.0000 mg | INTRAMUSCULAR | Status: DC | PRN
  Administered 2020-09-03: 5 mg via INTRAVENOUS

## 2020-09-03 MED ORDER — NIFEDIPINE ER OSMOTIC RELEASE 30 MG PO TB24
60.0000 mg | ORAL_TABLET | Freq: Every day | ORAL | Status: DC
  Administered 2020-09-03 – 2020-09-04 (×2): 60 mg via ORAL
  Filled 2020-09-03 (×2): qty 2

## 2020-09-03 MED ORDER — FUROSEMIDE 10 MG/ML IJ SOLN
10.0000 mg | Freq: Once | INTRAMUSCULAR | Status: AC
  Administered 2020-09-03: 10 mg via INTRAVENOUS
  Filled 2020-09-03: qty 2

## 2020-09-03 MED ORDER — LABETALOL HCL 5 MG/ML IV SOLN: 40.0000 mg | INTRAVENOUS | Status: DC | PRN

## 2020-09-03 MED ORDER — HYDRALAZINE HCL 20 MG/ML IJ SOLN: 10.0000 mg | INTRAMUSCULAR | Status: DC | PRN

## 2020-09-03 NOTE — Progress Notes (Signed)
Patient screened out for psychosocial assessment since none of the following apply:  Psychosocial stressors documented in mother or baby's chart  Gestation less than 32 weeks  Code at delivery   Infant with anomalies Please contact the Clinical Social Worker if specific needs arise, by MOB's request, or if MOB scores greater than 9/yes to question 10 on Edinburgh Postpartum Depression Screen.  Rosena Bartle, LCSW Clinical Social Worker [facility] Cell#: [phone]     

## 2020-09-03 NOTE — Lactation Note (Addendum)
This note was copied from a baby's chart. Lactation Consultation Note  Patient Name: Brandy Mack WLSLH'T Date: 09/03/2020 Reason for consult: Follow-up assessment;NICU baby;Late-preterm 34-36.6wks;Infant < 6lbs  LC in to visit with P3 Mom of LPTI infant in the NICU.  Baby 99 hrs old and Mom has been consistently pumping and volume is in.  Mom currently pumping 4-8 oz per session.  Mom instructed to change to maintenance setting on pump and encouraged pumping every 2-3 hrs during the day and 3-4 hrs at night for 15-30 min.  Increased flange size due to rubbing noted in flange, from 24-27 mm.    Mom has a Spectra DEBP at home.  May be discharged later today or tomorrow.  Mom states baby latched yesterday for a few sucks, but today baby was too sleepy.   Mom aware she can ask baby's RN to call [facility] for assistance prn.    Interventions Interventions: Breast feeding basics reviewed;Skin to skin;Breast massage;Hand express;DEBP  Lactation Tools Discussed/Used Tools: Pump;Flanges Flange Size: 27 Breast pump type: Double-Electric Breast Pump   Consult Status Consult Status: Follow-up Date: 09/04/20 Follow-up type: In-patient    Brandy Mack, Brandy Mack 09/03/2020, 12:19 PM

## 2020-09-03 NOTE — Progress Notes (Signed)
POSTPARTUM PROGRESS NOTE  POD #3  Subjective:  Brandy Mack is a 34 y.o. [phone] s/p primary LTCS at [redacted]w[redacted]d.  She reports she doing well. No acute events overnight. She denies any problems with ambulating, voiding or po intake. Denies nausea or vomiting. She has  passed flatus. Pain is well controlled.  Lochia is normal.  Last BP was considered severe range and she is starting hydralazine protocol.  Objective: Blood pressure (!) 165/94, pulse 70, temperature 98.2 F (36.8 C), temperature source Oral, resp. rate 20, height 5\' 3"  (1.6 m), weight 109.3 kg, last menstrual period 12/25/2019, SpO2 100 %, unknown if currently breastfeeding.  Physical Exam:  General: alert, cooperative and no distress Chest: no respiratory distress Heart:regular rate, distal pulses intact Abdomen: soft, nontender,  Uterine Fundus: firm, appropriately tender DVT Evaluation: No calf swelling or tenderness Extremities: minimal edema Skin: warm, dry; incision clean/dry/intact w/ honeycomb dressing in place, small amount of resolving bruising above the wound  Recent Labs    09/01/20 0508  HGB 10.2*  HCT 31.3*    Assessment/Plan: Brandy Mack is a 34 y.o. [id number] s/p primary LTCS at [redacted]w[redacted]d for chronic hypertension with superimposed severe preeclampsia.  POD#3 - today BP still elevated Will increase procardia to 60 mg daily, maintain HCTZ at 25 mg daily If BP well controlled, possible PM d/c, otherwise reassess on POD 4  LOS: 4 days   Lynnda Shields, MD Faculty Attending Center for [place] 09/03/2020, 6:56 AM

## 2020-09-04 ENCOUNTER — Other Ambulatory Visit (HOSPITAL_COMMUNITY): Payer: Self-pay | Admitting: Obstetrics and Gynecology

## 2020-09-04 ENCOUNTER — Encounter: Payer: BC Managed Care – PPO | Admitting: Family Medicine

## 2020-09-04 MED ORDER — NIFEDIPINE ER 60 MG PO TB24
60.0000 mg | ORAL_TABLET | Freq: Every day | ORAL | 1 refills | Status: DC
Start: 2020-09-04 — End: 2020-10-10

## 2020-09-04 MED ORDER — OXYCODONE HCL 5 MG PO TABS: 5.0000 mg | ORAL_TABLET | Freq: Four times a day (QID) | ORAL | 0 refills | Status: DC | PRN

## 2020-09-04 MED ORDER — HYDROCHLOROTHIAZIDE 25 MG PO TABS
25.0000 mg | ORAL_TABLET | Freq: Every day | ORAL | 1 refills | Status: DC
Start: 2020-09-04 — End: 2020-11-01

## 2020-09-04 MED FILL — NIFEdipine ER 60 MG TB24: 60 | 30 days supply | Qty: 30 | Fill #0

## 2020-09-04 MED FILL — HYDROCHLOROTHIAZIDE 25 MG T: 25 | 30 days supply | Qty: 30 | Fill #0

## 2020-09-04 MED FILL — oxyCODONE HCL 5 MG TABS: 5 | 6 days supply | Qty: 24 | Fill #0

## 2020-09-04 NOTE — Progress Notes (Signed)
Pt discharged to home with significant other.  Condition stable.  Pt ambulated to car with significant other.  No equipment for home ordered at discharge.

## 2020-09-04 NOTE — Lactation Note (Signed)
This note was copied from a baby's chart. Lactation Consultation Note  Patient Name: Brandy Mack JEHUD'[id number] Date: 09/04/2020  Mom reports they are being d/c today.  Mom getting good volumes of milk.  Mom reports she  has more than baby Brandy Mack can take right now.  Urged mom not to lower or decrease pumping at this time that she is still on endocrine system for production and needs good stimulation for later milk production and needs to establish good healthy milk supply.  Discussed she may be able to lower pumpings later.  Mom reports her breasts are uncomfortable and knotty.  Mom pumping 15-20 minutes per session about every 2 hours during the day.  Discussed massage and hand expression with pumping and to [possibly pump up to 30 minutes.  Mom has Spectra DEBP for home use. Discussed if mom too uncomfortable she could pump a little more often.  Mom reports barely had enough with her last babies so this is different for her. Mom plans to take her pump parts for DEBP to babies bedside so she can use [employer] pump while her and her Spectra DEBP at home.   Urged mom to call lactation as needed.   Maternal Data    Feeding Feeding Type: Breast Milk  LATCH Score                   Interventions    Lactation Tools Discussed/Used     Consult Status      Brandy Mack Brandy Mack 09/04/2020, 1:09 PM

## 2020-09-04 NOTE — Discharge Instructions (Signed)
Cesarean Delivery, Care After This sheet gives you information about how to care for yourself after your procedure. Your health care provider may also give you more specific instructions. If you have problems or questions, contact your health care provider. What can I expect after the procedure? After the procedure, it is common to have:  A small amount of blood or clear fluid coming from the incision.  Some redness, swelling, and pain in your incision area.  Some abdominal pain and soreness.  Vaginal bleeding (lochia). Even though you did not have a vaginal delivery, you will still have vaginal bleeding and discharge.  Pelvic cramps.  Fatigue. You may have pain, swelling, and discomfort in the tissue between your vagina and your anus (perineum) if:  Your C-section was unplanned, and you were allowed to labor and push.  An incision was made in the area (episiotomy) or the tissue tore during attempted vaginal delivery. Follow these instructions at home: Incision care   Follow instructions from your health care provider about how to take care of your incision. Make sure you: ? Wash your hands with soap and water before you change your bandage (dressing). If soap and water are not available, use hand sanitizer. ? If you have a dressing, change it or remove it as told by your health care provider. ? Leave stitches (sutures), skin staples, skin glue, or adhesive strips in place. These skin closures may need to stay in place for 2 weeks or longer. If adhesive strip edges start to loosen and curl up, you may trim the loose edges. Do not remove adhesive strips completely unless your health care provider tells you to do that.  Check your incision area every day for signs of infection. Check for: ? More redness, swelling, or pain. ? More fluid or blood. ? Warmth. ? Pus or a bad smell.  Do not take baths, swim, or use a hot tub until your health care provider says it's okay. Ask your health  care provider if you can take showers.  When you cough or sneeze, hug a pillow. This helps with pain and decreases the chance of your incision opening up (dehiscing). Do this until your incision heals. Medicines  Take over-the-counter and prescription medicines only as told by your health care provider.  If you were prescribed an antibiotic medicine, take it as told by your health care provider. Do not stop taking the antibiotic even if you start to feel better.  Do not drive or use heavy machinery while taking prescription pain medicine. Lifestyle  Do not drink alcohol. This is especially important if you are breastfeeding or taking pain medicine.  Do not use any products that contain nicotine or tobacco, such as cigarettes, e-cigarettes, and chewing tobacco. If you need help quitting, ask your health care provider. Eating and drinking  Drink at least 8 eight-ounce glasses of water every day unless told not to by your health care provider. If you breastfeed, you may need to drink even more water.  Eat high-fiber foods every day. These foods may help prevent or relieve constipation. High-fiber foods include: ? Whole grain cereals and breads. ? Brown rice. ? Beans. ? Fresh fruits and vegetables. Activity   If possible, have someone help you care for your baby and help with household activities for at least a few days after you leave the hospital.  Return to your normal activities as told by your health care provider. Ask your health care provider what activities are safe for   you.  Rest as much as possible. Try to rest or take a nap while your baby is sleeping.  Do not lift anything that is heavier than 10 lbs (4.5 kg), or the limit that you were told, until your health care provider says that it is safe.  Talk with your health care provider about when you can engage in sexual activity. This may depend on your: ? Risk of infection. ? How fast you heal. ? Comfort and desire to  engage in sexual activity. General instructions  Do not use tampons or douches until your health care provider approves.  Wear loose, comfortable clothing and a supportive and well-fitting bra.  Keep your perineum clean and dry. Wipe from front to back when you use the toilet.  If you pass a blood clot, save it and call your health care provider to discuss. Do not flush blood clots down the toilet before you get instructions from your health care provider.  Keep all follow-up visits for you and your baby as told by your health care provider. This is important. Contact a health care provider if:  You have: ? A fever. ? Bad-smelling vaginal discharge. ? Pus or a bad smell coming from your incision. ? Difficulty or pain when urinating. ? A sudden increase or decrease in the frequency of your bowel movements. ? More redness, swelling, or pain around your incision. ? More fluid or blood coming from your incision. ? A rash. ? Nausea. ? Little or no interest in activities you used to enjoy. ? Questions about caring for yourself or your baby.  Your incision feels warm to the touch.  Your breasts turn red or become painful or hard.  You feel unusually sad or worried.  You vomit.  You pass a blood clot from your vagina.  You urinate more than usual.  You are dizzy or light-headed. Get help right away if:  You have: ? Pain that does not go away or get better with medicine. ? Chest pain. ? Difficulty breathing. ? Blurred vision or spots in your vision. ? Thoughts about hurting yourself or your baby. ? New pain in your abdomen or in one of your legs. ? A severe headache.  You faint.  You bleed from your vagina so much that you fill more than one sanitary pad in one hour. Bleeding should not be heavier than your heaviest period. Summary  After the procedure, it is common to have pain at your incision site, abdominal cramping, and slight bleeding from your vagina.  Check  your incision area every day for signs of infection.  Tell your health care provider about any unusual symptoms.  Keep all follow-up visits for you and your baby as told by your health care provider. This information is not intended to replace advice given to you by your health care provider. Make sure you discuss any questions you have with your health care provider. Document Revised: 04/21/2018 Document Reviewed: 04/21/2018 Elsevier Patient Education  [place] to bedsider.org for more information!  Contraception Choices Contraception, also called birth control, refers to methods or devices that prevent pregnancy. Hormonal methods Contraceptive implant A contraceptive implant is a thin, plastic tube that contains a hormone. It is inserted into the upper part of the arm. It can remain in place for up to 3 years. Progestin-only injections Progestin-only injections are injections of progestin, a synthetic form of the hormone progesterone. They are given every 3 months by a health care provider. Birth  control pills Birth control pills are pills that contain hormones that prevent pregnancy. They must be taken once a day, preferably at the same time each day. Birth control patch The birth control patch contains hormones that prevent pregnancy. It is placed on the skin and must be changed once a week for three weeks and removed on the fourth week. A prescription is needed to use this method of contraception. Vaginal ring A vaginal ring contains hormones that prevent pregnancy. It is placed in the vagina for three weeks and removed on the fourth week. After that, the process is repeated with a new ring. A prescription is needed to use this method of contraception. Emergency contraceptive Emergency contraceptives prevent pregnancy after unprotected sex. They come in pill form and can be taken up to 5 days after sex. They work best the sooner they are taken after having sex. Most  emergency contraceptives are available without a prescription. This method should not be used as your only form of birth control. Barrier methods Female condom A female condom is a thin sheath that is worn over the penis during sex. Condoms keep sperm from going inside a woman's body. They can be used with a spermicide to increase their effectiveness. They should be disposed after a single use. Female condom A female condom is a soft, loose-fitting sheath that is put into the vagina before sex. The condom keeps sperm from going inside a woman's body. They should be disposed after a single use.  Intrauterine contraception Intrauterine device (IUD) An IUD is a T-shaped device that is put in a woman's uterus. There are two types:  Hormone IUD.This type contains progestin, a synthetic form of the hormone progesterone. This type can stay in place for 3-5 years.  Copper IUD.This type is wrapped in copper wire. It can stay in place for 10 years.  Permanent methods of contraception Female tubal ligation In this method, a woman's fallopian tubes are sealed, tied, or blocked during surgery to prevent eggs from traveling to the uterus.  Female sterilization This is a procedure to tie off the tubes that carry sperm (vasectomy). After the procedure, the man can still ejaculate fluid (semen).  Summary  Contraception, also called birth control, means methods or devices that prevent pregnancy.  Hormonal methods of contraception include implants, injections, pills, patches, vaginal rings, and emergency contraceptives.  Barrier methods of contraception can include female condoms, female condoms, diaphragms, cervical caps, sponges, and spermicides.  There are two types of IUDs (intrauterine devices). An IUD can be put in a woman's uterus to prevent pregnancy for 3-5 years.  Permanent sterilization can be done through a procedure for males, females, or both. This information is not intended to replace advice  given to you by your health care provider. Make sure you discuss any questions you have with your health care provider. Document Released: 10/13/2005 Document Revised: 11/15/2016 Document Reviewed: 11/15/2016 Elsevier Interactive Patient Education  [employer].  Use the following websites (and others) to help learn more about your contraception options and find the method that is right for you!  - The Centers for Disease Control [employer]) website: MomentumMarket.be  - [facility] website: https://www.plannedparenthood.org/learn/birth-control  - Bedsider.org: https://www.bedsider.org/methods

## 2020-09-05 ENCOUNTER — Ambulatory Visit: Payer: BC Managed Care – PPO

## 2020-09-07 ENCOUNTER — Ambulatory Visit (INDEPENDENT_AMBULATORY_CARE_PROVIDER_SITE_OTHER): Payer: BC Managed Care – PPO | Admitting: *Deleted

## 2020-09-07 ENCOUNTER — Other Ambulatory Visit: Payer: Self-pay

## 2020-09-07 VITALS — BP 139/90 | HR 106

## 2020-09-07 DIAGNOSIS — O1413 Severe pre-eclampsia, third trimester: Secondary | ICD-10-CM

## 2020-09-07 NOTE — Progress Notes (Signed)
Subjective:  Brandy Mack is a 34 y.o. female here for BP check and incision check.   Hypertension ROS: taking medications as instructed, no medication side effects noted, no TIA's, no chest pain on exertion, no dyspnea on exertion and no swelling of ankles.    Objective:  BP 139/90   Pulse (!) 106   Appearance alert, well appearing, and in no distress. General exam BP noted to be well controlled today in office.  Incision healing well, still had honeycomb dressing, was told to take it off Monday. No drainage or redness noted.    Assessment:   Blood Pressure improved.   Plan:  Current treatment plan is effective, no change in therapy. Pt to take off honeycomb Monday and will call the office if she has any concerns.

## 2020-09-07 NOTE — Progress Notes (Signed)
Patient was assessed and managed by nursing staff during this encounter. I have reviewed the chart and agree with the documentation and plan. I have also made any necessary editorial changes.  Verita Schneiders, MD 09/07/2020 10:37 AM

## 2020-09-08 ENCOUNTER — Ambulatory Visit: Payer: Self-pay

## 2020-09-08 NOTE — Lactation Note (Signed)
This note was copied from a baby's chart. Lactation Consultation Note  Patient Name: Brandy Mack NZUDO'D Date: 09/08/2020 Reason for consult: Follow-up assessment;NICU baby;Other (Comment) (discharge)  LC to room for follow up visit.  Infant to d/c today. Mother continues to pump and plans to bf when baby is home. Patient was provided with the opportunity to ask questions. All concerns were addressed.    Consult Status Consult Status: Complete Date: 09/08/20 Follow-up type: In-patient    Gwynne Edinger 09/08/2020, 2:54 PM

## 2020-09-10 ENCOUNTER — Inpatient Hospital Stay (HOSPITAL_COMMUNITY): Payer: BC Managed Care – PPO

## 2020-09-12 ENCOUNTER — Ambulatory Visit: Payer: BC Managed Care – PPO

## 2020-09-15 ENCOUNTER — Inpatient Hospital Stay (HOSPITAL_COMMUNITY): Payer: BC Managed Care – PPO

## 2020-09-18 ENCOUNTER — Encounter: Payer: Self-pay | Admitting: *Deleted

## 2020-09-19 ENCOUNTER — Encounter: Payer: Self-pay | Admitting: *Deleted

## 2020-09-19 ENCOUNTER — Other Ambulatory Visit: Payer: Self-pay | Admitting: *Deleted

## 2020-09-29 ENCOUNTER — Other Ambulatory Visit: Payer: Self-pay

## 2020-09-30 ENCOUNTER — Inpatient Hospital Stay (HOSPITAL_COMMUNITY): Admit: 2020-09-30 | Payer: Self-pay

## 2020-10-01 NOTE — Telephone Encounter (Signed)
Not given by you will not let me decline you will have to.

## 2020-10-03 ENCOUNTER — Other Ambulatory Visit: Payer: Self-pay | Admitting: Obstetrics and Gynecology

## 2020-10-10 ENCOUNTER — Ambulatory Visit (INDEPENDENT_AMBULATORY_CARE_PROVIDER_SITE_OTHER): Payer: BC Managed Care – PPO | Admitting: Family Medicine

## 2020-10-10 ENCOUNTER — Other Ambulatory Visit: Payer: Self-pay

## 2020-10-10 ENCOUNTER — Encounter: Payer: Self-pay | Admitting: Family Medicine

## 2020-10-10 VITALS — Wt 214.8 lb

## 2020-10-10 DIAGNOSIS — O24410 Gestational diabetes mellitus in pregnancy, diet controlled: Secondary | ICD-10-CM

## 2020-10-10 DIAGNOSIS — O24430 Gestational diabetes mellitus in the puerperium, diet controlled: Secondary | ICD-10-CM

## 2020-10-10 NOTE — Progress Notes (Signed)
[place] Partum Visit Note  Brandy Mack is a 34 y.o. [phone] female who presents for a postpartum visit. She is 5 weeks postpartum following a primary cesarean section.  I have fully reviewed the prenatal and intrapartum course. The delivery was at [redacted]w[redacted]d gestational weeks.  Anesthesia: epidural. Postpartum course has been uncomplicated. Baby is doing well- was in the NICU for 1 week, has gained 3lbs. Baby is feeding by both breast and bottle - Similac Neosure. Pumping exclusively. Bleeding no bleeding. Bowel function is normal. Bladder function is normal. Patient is not sexually active. Contraception method is none. Postpartum depression screening: negative.   The pregnancy intention screening data noted above was reviewed. Potential methods of contraception were discussed. The patient elected to proceed with Abstinence.   Past BCM:  Edinburgh Postnatal Depression Scale - 10/10/20 0857      Edinburgh Postnatal Depression Scale:  In the Past 7 Days   I have been able to laugh and see the funny side of things. 0    I have looked forward with enjoyment to things. 0    I have blamed myself unnecessarily when things went wrong. 0    I have been anxious or worried for no good reason. 0    I have felt scared or panicky for no good reason. 0    Things have been getting on top of me. 0    I have been so unhappy that I have had difficulty sleeping. 0    I have felt sad or miserable. 0    I have been so unhappy that I have been crying. 0    The thought of harming myself has occurred to me. 0    Edinburgh Postnatal Depression Scale Total 0         Nexplanon- caused water retention, BP increased Pills- never been on   The following portions of the patient's history were reviewed and updated as appropriate: allergies, current medications, past family history, past medical history, past social history, past surgical history and problem list.  Review of Systems Pertinent items are noted  in HPI.    Objective:  Wt 214 lb 12.8 oz (97.4 kg)   Breastfeeding Yes   BMI 38.05 kg/m    BP 150s/90 today  General:  alert, cooperative and appears stated age   Breasts:  no concerns, exam not indicated  Lungs: normal WOB  Heart:  RR  Abdomen: soft, non-tender; bowel sounds normal; no masses,  no organomegaly and C-section scar is c/d/i   Vulva:  not evaluated  Vagina: not evaluated  Cervix:  not evaluated  Corpus: not examined  Adnexa:  not evaluated  Rectal Exam: Not performed.        Assessment:    Normal postpartum exam. Pap smear not done at today's visit.   Plan:   Essential components of care per ACOG recommendations:  1.  Mood and well being: Patient with negative depression screening today. Reviewed local resources for support.  - Patient does not use tobacco.  - hx of drug use? No     2. Infant care and feeding:  -Patient currently breastmilk feeding? Yes  If breastmilk feeding discussed return to work and pumping. If needed, patient was provided letter for work to allow for every 2-3 hr pumping breaks, and to be granted a private location to express breastmilk and refrigerated area to store breastmilk. Reviewed importance of draining breast regularly to support lactation. -Social determinants of health (SDOH) reviewed in  EPIC. No concerns  3. Sexuality, contraception and birth spacing - Patient does not want a pregnancy in the next year.  Desired family size is 3 children.  - Reviewed forms of contraception in tiered fashion. She is considering IUD vs tubal =-given information about LNG IUD and Cu-IUD - Discussed birth spacing of 18 months  4. Sleep and fatigue -Encouraged family/partner/community support of 4 hrs of uninterrupted sleep to help with mood and fatigue  5. Physical Recovery  - Discussed patients c-section and complications - Patient has urinary incontinence? No - Patient is safe to resume physical and sexual activity  6.  Health  Maintenance - Last pap smear done 5/2021and was normal with negative HPV. - Mammogram @50   7.Chronic Disease - HTN- elevated today but reports 140/90s at home. She is on HCTZ, stopped procardia. Will follow up with PCP and here for BP check.   COVID-19 Vaccine Counseling: The patient was counseled on the potential benefits and lack of known risks of COVID vaccination, during pregnancy and breastfeeding, during today's visit. The patient's questions and concerns were addressed today, including safety of the vaccination and potential side effects as they have been published by ACOG and SMFM. The patient has been informed that there have not been any documented vaccine related injuries, deaths or birth defects to infant or mom after receiving the COVID-19 vaccine to date. The patient has been made aware that although she is not at increased risk of contracting COVID-19 during pregnancy, she is at increased risk of developing severe disease and complications if she contracts COVID-19 while pregnant. All patient questions were addressed during our visit today. The patient is still unsure of her decision for vaccination.    Caren Macadam, MD [facility] for [facility]

## 2020-10-16 ENCOUNTER — Other Ambulatory Visit: Payer: Self-pay

## 2020-10-16 ENCOUNTER — Other Ambulatory Visit: Payer: BC Managed Care – PPO

## 2020-10-16 DIAGNOSIS — O24410 Gestational diabetes mellitus in pregnancy, diet controlled: Secondary | ICD-10-CM

## 2020-10-17 LAB — GLUCOSE TOLERANCE, 2 HOURS
Glucose, 2 hour: 113 mg/dL (ref 65–139)
Glucose, GTT - Fasting: 95 mg/dL (ref 65–99)

## 2020-10-18 ENCOUNTER — Encounter: Payer: Self-pay | Admitting: Obstetrics & Gynecology

## 2020-10-30 ENCOUNTER — Other Ambulatory Visit: Payer: Self-pay | Admitting: *Deleted

## 2020-10-31 ENCOUNTER — Other Ambulatory Visit: Payer: Self-pay | Admitting: Obstetrics and Gynecology

## 2020-11-01 ENCOUNTER — Other Ambulatory Visit: Payer: Self-pay

## 2020-11-01 MED ORDER — HYDROCHLOROTHIAZIDE 25 MG PO TABS
25.0000 mg | ORAL_TABLET | Freq: Every day | ORAL | 1 refills | Status: DC
Start: 2020-11-01 — End: 2020-12-14

## 2020-11-27 DIAGNOSIS — Z419 Encounter for procedure for purposes other than remedying health state, unspecified: Secondary | ICD-10-CM | POA: Diagnosis not present

## 2020-12-13 ENCOUNTER — Other Ambulatory Visit: Payer: Self-pay | Admitting: Obstetrics & Gynecology

## 2020-12-14 NOTE — Telephone Encounter (Signed)
Further antihypertensive refills need to be done by primary care physician.   Verita Schneiders, MD

## 2020-12-17 ENCOUNTER — Encounter: Payer: Self-pay | Admitting: Obstetrics and Gynecology

## 2020-12-17 ENCOUNTER — Ambulatory Visit (INDEPENDENT_AMBULATORY_CARE_PROVIDER_SITE_OTHER): Payer: BC Managed Care – PPO | Admitting: Obstetrics and Gynecology

## 2020-12-17 ENCOUNTER — Other Ambulatory Visit: Payer: Self-pay

## 2020-12-17 VITALS — BP 158/109 | HR 91 | Wt 217.0 lb

## 2020-12-17 DIAGNOSIS — I10 Essential (primary) hypertension: Secondary | ICD-10-CM

## 2020-12-17 DIAGNOSIS — Z30430 Encounter for insertion of intrauterine contraceptive device: Secondary | ICD-10-CM

## 2020-12-17 HISTORY — PX: IUD INSERTION: OBO1003

## 2020-12-17 MED ORDER — PARAGARD INTRAUTERINE COPPER IU IUD
INTRAUTERINE_SYSTEM | Freq: Once | INTRAUTERINE | Status: AC
Start: 2020-12-17 — End: 2020-12-17

## 2020-12-17 NOTE — Procedures (Addendum)
Intrauterine Device (IUD) Insertion Procedure Note  After a recent pap (results: negative and hpv negative/date: 2021) was confirmed, written consent was obtained; patient finishing her cycle.  The patient understands the risks of IUD placement, which include but are not limited to: bleeding, infection, uterine perforation, risk of expulsion, risk of failure < 1%, increased risk of ectopic pregnancy in the event of failure.   Prior to the procedure being performed, the patient (or guardian) was asked to state their full name, date of birth, and the type of procedure being performed. A bimanual exam showed the uterus to be midposition.  Next, the cervix and vagina were cleaned with an antiseptic solution, and the cervix was grasped with a tenaculum.  The uterus was sounded to 7 cm.  The ParaGard was placed without difficulty in the usual fashion.  The strings were cut to 3-4 cm.  The tenaculum was removed and cervix was found to be hemostatic.    No complications, patient tolerated the procedure well.   Patient forgot to take her HCTZ today; patient to make appointment with her PCP for BP follow up. She denies any chest pain, sob, neuro s/s. ED precautions given   Durene Romans MD Attending Center for [facility])

## 2020-12-25 DIAGNOSIS — Z419 Encounter for procedure for purposes other than remedying health state, unspecified: Secondary | ICD-10-CM | POA: Diagnosis not present

## 2021-01-25 DIAGNOSIS — Z419 Encounter for procedure for purposes other than remedying health state, unspecified: Secondary | ICD-10-CM | POA: Diagnosis not present

## 2021-01-28 ENCOUNTER — Encounter: Payer: Self-pay | Admitting: Obstetrics and Gynecology

## 2021-01-28 ENCOUNTER — Other Ambulatory Visit: Payer: Self-pay

## 2021-01-28 ENCOUNTER — Ambulatory Visit (INDEPENDENT_AMBULATORY_CARE_PROVIDER_SITE_OTHER): Payer: BC Managed Care – PPO | Admitting: Obstetrics and Gynecology

## 2021-01-28 VITALS — BP 156/94 | HR 76

## 2021-01-28 DIAGNOSIS — Z30431 Encounter for routine checking of intrauterine contraceptive device: Secondary | ICD-10-CM

## 2021-01-28 NOTE — Progress Notes (Signed)
Obstetrics and Gynecology Visit Return Patient Evaluation  Appointment Date: 01/28/2021  Primary Care Provider: Pleas Mack  OBGYN Clinic: [facility]  Chief Complaint: Paragard IUD string check  History of Present Illness:  Brandy Mack is a 35 y.o. s/p 2/21 IUD insertion  Interval History: Since that time, she states that she is doing well. No issues with sexual intercourse. Had a period since placement and was a little heavier than prior ones but not bad.   Review of Systems: as noted in the History of Present Illness.  Medications:  Brandy Mack. Brandy Mack had no medications administered during this visit. Current Outpatient Medications  Medication Sig Dispense Refill  . hydrochlorothiazide (HYDRODIURIL) 25 MG tablet TAKE 1 TABLET(25 MG) BY MOUTH DAILY 90 tablet 0   No current facility-administered medications for this visit.    Allergies: is allergic to adalat [nifedipine], naproxen, and other.  Physical Exam:  BP (!) 156/94   Pulse 76  There is no height or weight on file to calculate BMI. General appearance: Well nourished, well developed female in no acute distress.  Abdomen: diffusely non tender to palpation, non distended, and no masses, hernias Neuro/Psych:  Normal mood and affect.    Pelvic exam:  EGBUS, vaginal vault and cervix: within normal limits. White IUD strings seen coming from the os, approx 3cm in length   Assessment: doing well  Plan:  1. IUD check up Routine care. I d/w her that vast majority of patients go back to normal cycle intensity by about 3-54m.    RTC: PRN  Brandy Romans MD Attending Center for [facility])

## 2021-02-15 ENCOUNTER — Ambulatory Visit: Payer: BC Managed Care – PPO | Admitting: Primary Care

## 2021-02-22 ENCOUNTER — Other Ambulatory Visit: Payer: Self-pay | Admitting: Primary Care

## 2021-02-22 ENCOUNTER — Encounter: Payer: Self-pay | Admitting: Primary Care

## 2021-02-22 ENCOUNTER — Other Ambulatory Visit: Payer: Self-pay

## 2021-02-22 ENCOUNTER — Ambulatory Visit (INDEPENDENT_AMBULATORY_CARE_PROVIDER_SITE_OTHER): Payer: BC Managed Care – PPO | Admitting: Primary Care

## 2021-02-22 VITALS — BP 145/92 | HR 72 | Temp 98.6°F | Ht 63.0 in | Wt 217.0 lb

## 2021-02-22 DIAGNOSIS — O1003 Pre-existing essential hypertension complicating the puerperium: Secondary | ICD-10-CM | POA: Diagnosis not present

## 2021-02-22 MED ORDER — OLMESARTAN MEDOXOMIL 20 MG PO TABS: 20.0000 mg | ORAL_TABLET | Freq: Every day | ORAL | 0 refills | Status: DC

## 2021-02-22 NOTE — Patient Instructions (Signed)
Continue hydrochlorothiazide 25 mg daily for blood pressure.  Start olmesartan 20 mg once daily for blood pressure.  Stop by the lab prior to leaving today. I will notify you of your results once received.   Please schedule a follow up visit to meet back with me in 2-3 weeks for blood pressure check.   It was a pleasure to see you today!

## 2021-02-22 NOTE — Progress Notes (Signed)
Subjective:    Patient ID: Brandy Mack, female    DOB: 1986/08/01, 35 y.o.   MRN: [id number]  HPI  Brandy Mack is a very pleasant 35 y.o. female with a history of tobacco abuse who presents today to discuss hypertension.  Currently prescribed HCTZ 25 mg per GYN postpartum. She struggled with preeclampsia during her recent pregnancy. During pregnency she was on labetalol BID and later nifedipine which caused a rash.  She also had to stay 3-4 days postpartum due to HTN complications. She is post partum 6 months.  She's been working on dietary changes including cutting back on carbs, sugar, fried food, salt. She's eating more fresh food and lean protein. She is tolerating HCTZ 25 well.   She had BP issues with her middle child's pregnancy, was on HCTZ post partum at that time. She has strong FH of hypertension in both parents. She denies headaches in general, blurred vision, dizziness.   Wt Readings from Last 3 Encounters:  02/22/21 217 lb (98.4 kg)  12/17/20 217 lb (98.4 kg)  10/10/20 214 lb 12.8 oz (97.4 kg)     BP Readings from Last 3 Encounters:  02/22/21 (!) 145/92  01/28/21 (!) 156/94  12/17/20 (!) 158/109        Review of Systems  Eyes: Negative for visual disturbance.  Respiratory: Negative for shortness of breath.   Cardiovascular: Negative for chest pain.  Neurological: Negative for dizziness and headaches.         Past Medical History:  Diagnosis Date  . Chronic hypertension with superimposed severe preeclampsia 07/24/2020   [x]  Aspirin 81 mg daily after 12 weeks Current antihypertensives:  Labetalol   Baseline and surveillance labs (pulled in from [facility], refresh links as needed)  Lab Results Component Value Date  PLT 210 07/10/2020  CREATININE 0.57 07/10/2020  AST 15 07/10/2020  ALT 19 07/10/2020   Antenatal Testing CHTN - O10.919  Group I   BP < 140/90, no meds, no preeclampsia, AGA, nml AFV   Group II  BP > 140/90,   . Depression     denies  . Diet controlled gestational diabetes mellitus in third trimester 07/11/2020   Negative postpartum 2 hr GTT  . Elevated blood pressure   . Elevated blood pressure reading 05/16/2016  . Gestational diabetes   . Hypertension   . Knee pain, acute 05/16/2016  . Positive urine pregnancy test 02/07/2020  . Severe preeclampsia, third trimester 08/30/2020    Social History   Socioeconomic History  . Marital status: Single    Spouse name: Not on file  . Number of children: Not on file  . Years of education: Not on file  . Highest education level: Not on file  Occupational History  . Not on file  Tobacco Use  . Smoking status: Former Smoker    Packs/day: 0.25    Years: 10.00    Pack years: 2.50    Types: Cigarettes    Quit date: 01/26/2020    Years since quitting: 1.0  . Smokeless tobacco: Never Used  Vaping Use  . Vaping Use: Never used  Substance and Sexual Activity  . Alcohol use: Not Currently    Alcohol/week: 0.0 standard drinks    Comment: social  . Drug use: No  . Sexual activity: Yes    Birth control/protection: I.U.D.  Other Topics Concern  . Not on file  Social History Narrative   Single.   2 children.   Works at [employer].  Enjoys swimming, reading, spending time with her family.   Social Determinants of Health   Financial Resource Strain: Not on file  Food Insecurity: Not on file  Transportation Needs: Not on file  Physical Activity: Not on file  Stress: Not on file  Social Connections: Not on file  Intimate Partner Violence: Not on file    Past Surgical History:  Procedure Laterality Date  . CESAREAN SECTION N/A 08/31/2020   Procedure: CESAREAN SECTION;  Surgeon: Griffin Basil, MD;  Location: [facility] LD ORS;  Service: Obstetrics;  Laterality: N/A;  . IUD INSERTION  12/17/2020      . NO PAST SURGERIES      Family History  Adopted: Yes  Problem Relation Age of Onset  . Hypertension Father   . Stroke Father   . Hyperlipidemia Mother      Allergies  Allergen Reactions  . Adalat [Nifedipine] Hives  . Naproxen Itching and Swelling    Throat swells  . Other Hives and Swelling    Melons cause hives and throat swelling    Current Outpatient Medications on File Prior to Visit  Medication Sig Dispense Refill  . hydrochlorothiazide (HYDRODIURIL) 25 MG tablet TAKE 1 TABLET(25 MG) BY MOUTH DAILY 90 tablet 0  . PARAGARD INTRAUTERINE COPPER IU by Intrauterine route.     No current facility-administered medications on file prior to visit.    BP (!) 145/92   Pulse 72   Temp 98.6 F (37 C) (Temporal)   Ht 5\' 3"  (1.6 m)   Wt 217 lb (98.4 kg)   SpO2 97%   BMI 38.44 kg/m  Objective:   Physical Exam Cardiovascular:     Rate and Rhythm: Normal rate and regular rhythm.  Pulmonary:     Effort: Pulmonary effort is normal.     Breath sounds: Normal breath sounds.  Musculoskeletal:     Cervical back: Neck supple.  Skin:    General: Skin is warm and dry.           Assessment & Plan:      This visit occurred during the SARS-CoV-2 public health emergency.  Safety protocols were in place, including screening questions prior to the visit, additional usage of staff PPE, and extensive cleaning of exam room while observing appropriate contact time as indicated for disinfecting solutions.

## 2021-02-22 NOTE — Assessment & Plan Note (Signed)
Above goal on HCTZ 25 mg. Allergy to calcium channel blockers. Labetalol ineffective.   Continue HCTZ 25 mg. Add olmesartan 20 mg daily.  Checking labs today including TSH, A1C, CMP.  We will plan to see her back in 2-3 weeks for BP check and repeat BMP

## 2021-02-23 LAB — COMPREHENSIVE METABOLIC PANEL
AG Ratio: 1.7 (calc) (ref 1.0–2.5)
ALT: 13 U/L (ref 6–29)
AST: 13 U/L (ref 10–30)
Albumin: 4.4 g/dL (ref 3.6–5.1)
Alkaline phosphatase (APISO): 65 U/L (ref 31–125)
BUN: 12 mg/dL (ref 7–25)
CO2: 23 mmol/L (ref 20–32)
Calcium: 9.5 mg/dL (ref 8.6–10.2)
Chloride: 101 mmol/L (ref 98–110)
Creat: 0.76 mg/dL (ref 0.50–1.10)
Globulin: 2.6 g/dL (calc) (ref 1.9–3.7)
Glucose, Bld: 91 mg/dL (ref 65–99)
Potassium: 3.8 mmol/L (ref 3.5–5.3)
Sodium: 137 mmol/L (ref 135–146)
Total Bilirubin: 0.3 mg/dL (ref 0.2–1.2)
Total Protein: 7 g/dL (ref 6.1–8.1)

## 2021-02-23 LAB — HEMOGLOBIN A1C
Hgb A1c MFr Bld: 5.3 % of total Hgb (ref <5.7)
Mean Plasma Glucose: 105 mg/dL
eAG (mmol/L): 5.8 mmol/L

## 2021-02-23 LAB — TSH: TSH: 0.85 mIU/L

## 2021-02-24 DIAGNOSIS — Z419 Encounter for procedure for purposes other than remedying health state, unspecified: Secondary | ICD-10-CM | POA: Diagnosis not present

## 2021-03-08 ENCOUNTER — Encounter: Payer: Self-pay | Admitting: Primary Care

## 2021-03-08 ENCOUNTER — Other Ambulatory Visit: Payer: Self-pay

## 2021-03-08 ENCOUNTER — Ambulatory Visit (INDEPENDENT_AMBULATORY_CARE_PROVIDER_SITE_OTHER): Payer: BC Managed Care – PPO | Admitting: Primary Care

## 2021-03-08 DIAGNOSIS — O1003 Pre-existing essential hypertension complicating the puerperium: Secondary | ICD-10-CM | POA: Diagnosis not present

## 2021-03-08 LAB — BASIC METABOLIC PANEL
BUN: 14 mg/dL (ref 6–23)
CO2: 24 mEq/L (ref 19–32)
Calcium: 8.7 mg/dL (ref 8.4–10.5)
Chloride: 105 mEq/L (ref 96–112)
Creatinine, Ser: 0.71 mg/dL (ref 0.40–1.20)
GFR: 109.72 mL/min (ref >60.00)
Glucose, Bld: 93 mg/dL (ref 70–99)
Potassium: 3.8 mEq/L (ref 3.5–5.1)
Sodium: 138 mEq/L (ref 135–145)

## 2021-03-08 NOTE — Progress Notes (Signed)
Subjective:    Patient ID: Brandy Mack, female    DOB: 1986-07-13, 35 y.o.   MRN: [id number]  HPI  Brandy Mack is a very pleasant 35 y.o. female with a history of hypertension who presents today for follow up of hypertension.  She was last evaluated on 02/22/21 for post partum hypertension. History of preeclampsia during recent pregnancy, no improvement with labetalol and nifedipine caused a rash. She was on HCTZ already per GYN but BP was above goal so we added olmesartan 20 mg and asked her to follow up today. She is not breast feeding.  Since her last visit she's feeling well and tolerating HCTZ and olmesartan. Initially she felt some GI upset and increased thirst which was short lived. She denies dizziness, headaches. She is checking her BP at home which is running 120's/90's at home.   BP Readings from Last 3 Encounters:  03/08/21 110/62  02/22/21 (!) 145/92  01/28/21 (!) 156/94      Review of Systems  Eyes: Negative for visual disturbance.  Respiratory: Negative for shortness of breath.   Cardiovascular: Negative for chest pain.  Neurological: Negative for dizziness and headaches.         Past Medical History:  Diagnosis Date  . Chronic hypertension with superimposed severe preeclampsia 07/24/2020   [x]  Aspirin 81 mg daily after 12 weeks Current antihypertensives:  Labetalol   Baseline and surveillance labs (pulled in from [facility], refresh links as needed)  Lab Results Component Value Date  PLT 210 07/10/2020  CREATININE 0.57 07/10/2020  AST 15 07/10/2020  ALT 19 07/10/2020   Antenatal Testing CHTN - O10.919  Group I   BP < 140/90, no meds, no preeclampsia, AGA, nml AFV   Group II  BP > 140/90,   . Depression    denies  . Diet controlled gestational diabetes mellitus in third trimester 07/11/2020   Negative postpartum 2 hr GTT  . Elevated blood pressure   . Elevated blood pressure reading 05/16/2016  . Gestational diabetes   . Hypertension   . Knee  pain, acute 05/16/2016  . Positive urine pregnancy test 02/07/2020  . Severe preeclampsia, third trimester 08/30/2020    Social History   Socioeconomic History  . Marital status: Single    Spouse name: Not on file  . Number of children: Not on file  . Years of education: Not on file  . Highest education level: Not on file  Occupational History  . Not on file  Tobacco Use  . Smoking status: Former Smoker    Packs/day: 0.25    Years: 10.00    Pack years: 2.50    Types: Cigarettes    Quit date: 01/26/2020    Years since quitting: 1.1  . Smokeless tobacco: Never Used  Vaping Use  . Vaping Use: Never used  Substance and Sexual Activity  . Alcohol use: Not Currently    Alcohol/week: 0.0 standard drinks    Comment: social  . Drug use: No  . Sexual activity: Yes    Birth control/protection: I.U.D.  Other Topics Concern  . Not on file  Social History Narrative   Single.   2 children.   Works at [employer].   Enjoys swimming, reading, spending time with her family.   Social Determinants of Health   Financial Resource Strain: Not on file  Food Insecurity: Not on file  Transportation Needs: Not on file  Physical Activity: Not on file  Stress: Not on file  Social Connections: Not  on file  Intimate Partner Violence: Not on file    Past Surgical History:  Procedure Laterality Date  . CESAREAN SECTION N/A 08/31/2020   Procedure: CESAREAN SECTION;  Surgeon: Griffin Basil, MD;  Location: [facility] LD ORS;  Service: Obstetrics;  Laterality: N/A;  . IUD INSERTION  12/17/2020      . NO PAST SURGERIES      Family History  Adopted: Yes  Problem Relation Age of Onset  . Hypertension Father   . Stroke Father   . Hyperlipidemia Mother     Allergies  Allergen Reactions  . Adalat [Nifedipine] Hives  . Naproxen Itching and Swelling    Throat swells  . Other Hives and Swelling    Melons cause hives and throat swelling    Current Outpatient Medications on File Prior to Visit   Medication Sig Dispense Refill  . hydrochlorothiazide (HYDRODIURIL) 25 MG tablet TAKE 1 TABLET(25 MG) BY MOUTH DAILY 90 tablet 0  . olmesartan (BENICAR) 20 MG tablet TAKE 1 TABLET(20 MG) BY MOUTH DAILY FOR BLOOD PRESSURE 90 tablet 0  . PARAGARD INTRAUTERINE COPPER IU by Intrauterine route.     No current facility-administered medications on file prior to visit.    BP 110/62   Pulse 98   Temp 98.6 F (37 C) (Temporal)   Ht 5\' 3"  (1.6 m)   Wt 215 lb (97.5 kg)   SpO2 98%   BMI 38.09 kg/m  Objective:   Physical Exam Cardiovascular:     Rate and Rhythm: Normal rate and regular rhythm.  Pulmonary:     Effort: Pulmonary effort is normal.     Breath sounds: Normal breath sounds.  Musculoskeletal:     Cervical back: Neck supple.  Skin:    General: Skin is warm and dry.           Assessment & Plan:      This visit occurred during the SARS-CoV-2 public health emergency.  Safety protocols were in place, including screening questions prior to the visit, additional usage of staff PPE, and extensive cleaning of exam room while observing appropriate contact time as indicated for disinfecting solutions.

## 2021-03-08 NOTE — Patient Instructions (Signed)
Continue taking olmesartan 20 mg and hydrochlorothizide 25 mg daily for blood pressure.  Please notify me if you see readings consistently below 100/60 or above 130/90.   Stop by the lab prior to leaving today. I will notify you of your results once received.   It was a pleasure to see you today!

## 2021-03-08 NOTE — Assessment & Plan Note (Signed)
Improved and at goal on olmesartan 20 mg and HCTZ 25 mg.   Checking BMP today.  Discussed to notify if she develops dizziness on this regimen, would recommend cutting olmesartan to 10 mg if that is the case.  She will update. Continue HCTZ 25 mg and olmesartan 20 mg for now.

## 2021-03-27 DIAGNOSIS — Z419 Encounter for procedure for purposes other than remedying health state, unspecified: Secondary | ICD-10-CM | POA: Diagnosis not present

## 2021-04-12 ENCOUNTER — Other Ambulatory Visit: Payer: Self-pay | Admitting: Obstetrics & Gynecology

## 2021-04-15 ENCOUNTER — Other Ambulatory Visit: Payer: Self-pay

## 2021-04-15 DIAGNOSIS — I10 Essential (primary) hypertension: Secondary | ICD-10-CM

## 2021-04-16 MED ORDER — HYDROCHLOROTHIAZIDE 25 MG PO TABS: 25.0000 mg | ORAL_TABLET | Freq: Every day | ORAL | 3 refills | Status: DC

## 2021-04-26 DIAGNOSIS — Z419 Encounter for procedure for purposes other than remedying health state, unspecified: Secondary | ICD-10-CM | POA: Diagnosis not present

## 2021-05-21 ENCOUNTER — Other Ambulatory Visit: Payer: Self-pay | Admitting: Primary Care

## 2021-05-21 DIAGNOSIS — O1003 Pre-existing essential hypertension complicating the puerperium: Secondary | ICD-10-CM

## 2021-05-27 DIAGNOSIS — Z419 Encounter for procedure for purposes other than remedying health state, unspecified: Secondary | ICD-10-CM | POA: Diagnosis not present

## 2021-05-27 NOTE — Telephone Encounter (Signed)
Please call mom and set up appointment with provider here if we do not have any taking kids let me know and I will pass information on to mom.

## 2021-06-27 DIAGNOSIS — Z419 Encounter for procedure for purposes other than remedying health state, unspecified: Secondary | ICD-10-CM | POA: Diagnosis not present

## 2021-07-27 DIAGNOSIS — Z419 Encounter for procedure for purposes other than remedying health state, unspecified: Secondary | ICD-10-CM | POA: Diagnosis not present

## 2021-08-27 DIAGNOSIS — Z419 Encounter for procedure for purposes other than remedying health state, unspecified: Secondary | ICD-10-CM | POA: Diagnosis not present

## 2021-09-22 IMAGING — US US MFM FETAL BPP W/O NON-STRESS
1 series · 15 of 28 positions shown · non-contrast
Comparison: none

[Series 1: us mfm fetal bpp w/o non-stress · 43 acquisitions, 15 frames shown]
[im 1/43]
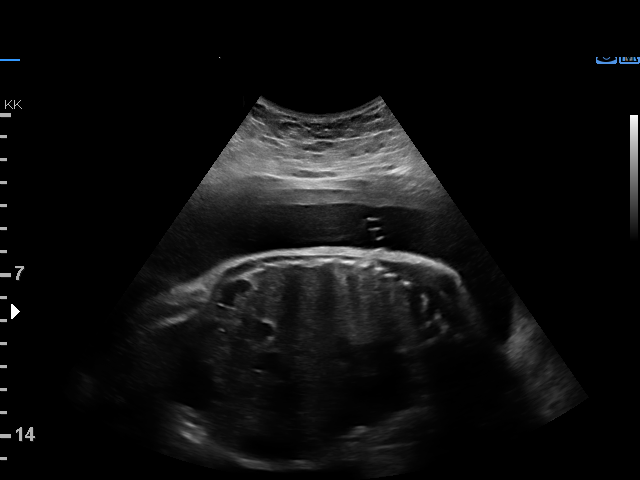
[im 4/43]
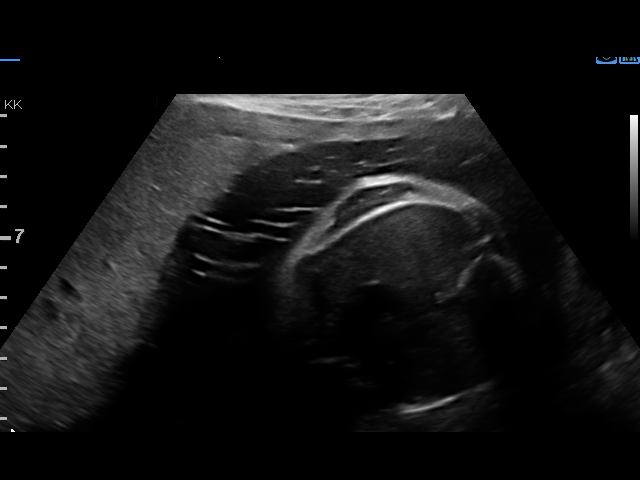
[im 7/43]
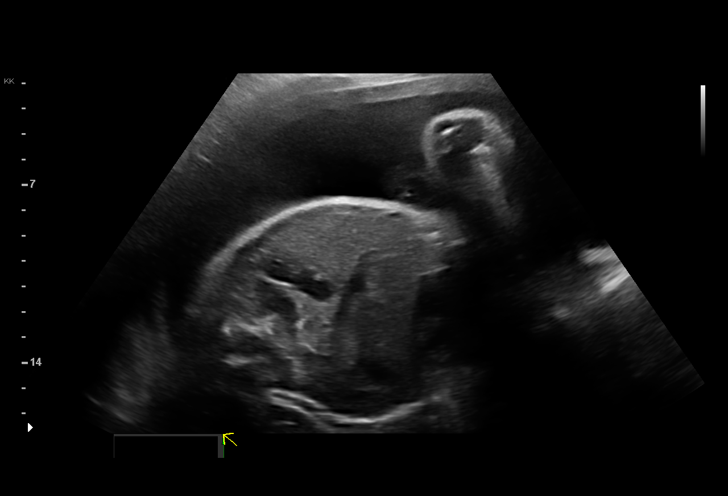
[im 10/43]
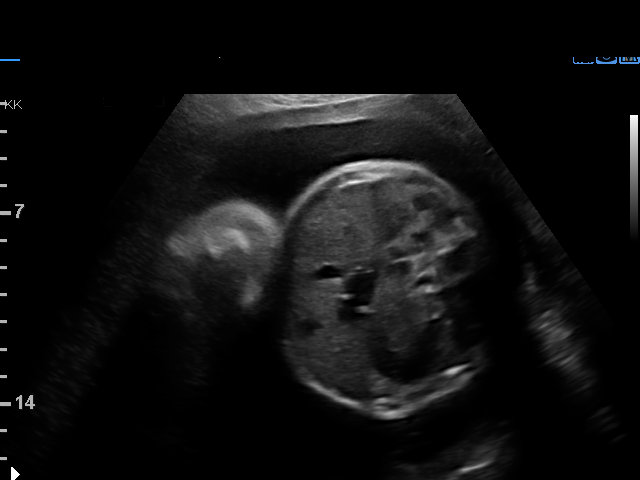
[im 13/43]
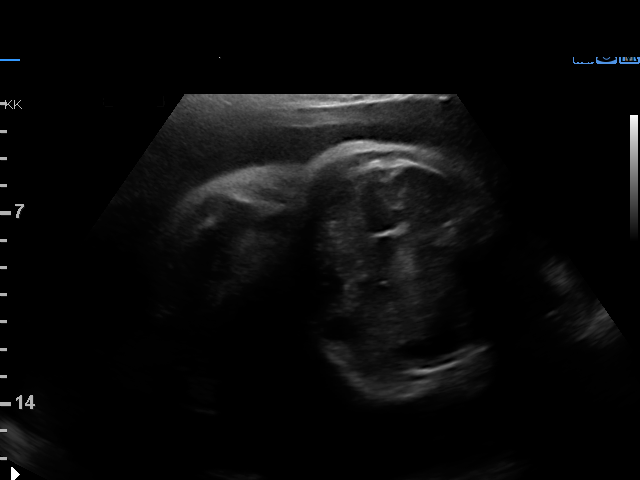
[im 16/43]
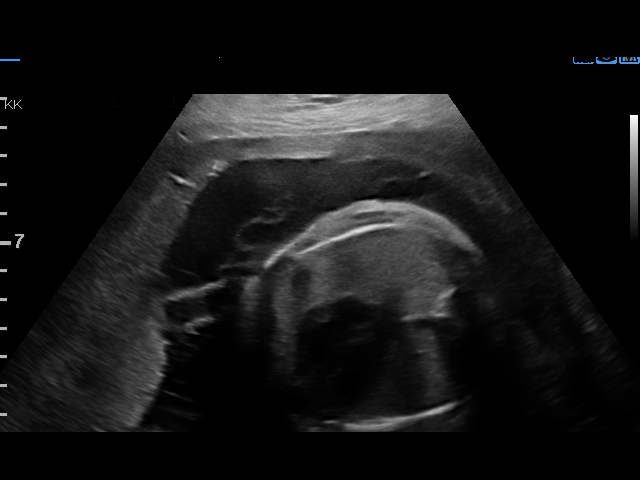
[im 19/43]
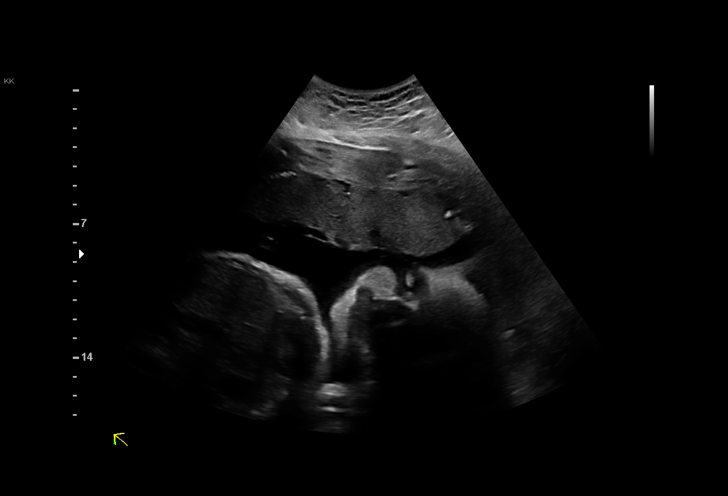
[im 22/43]
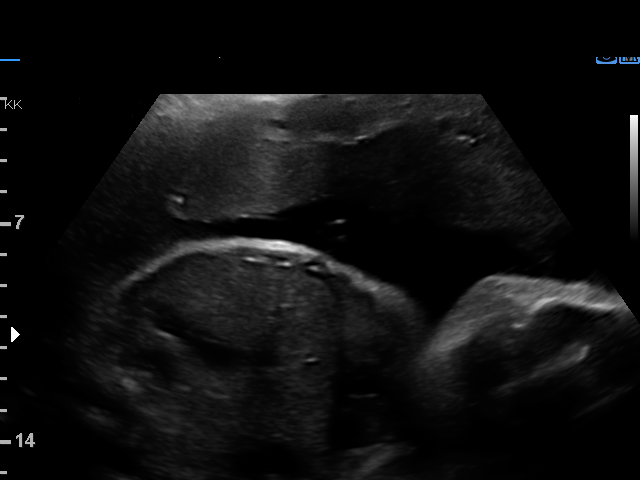
[im 24/43]
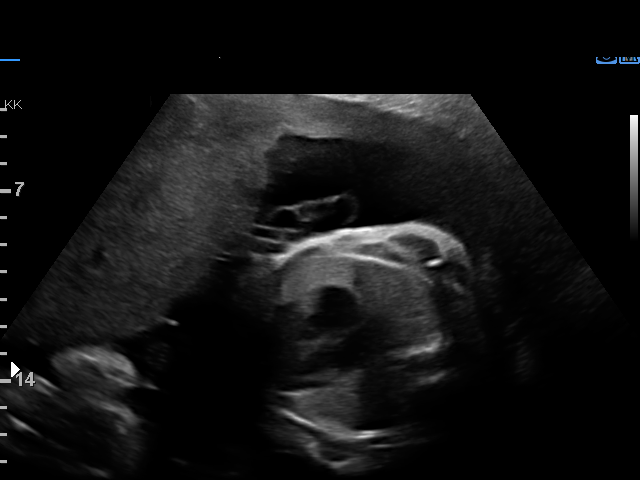
[im 27/43]
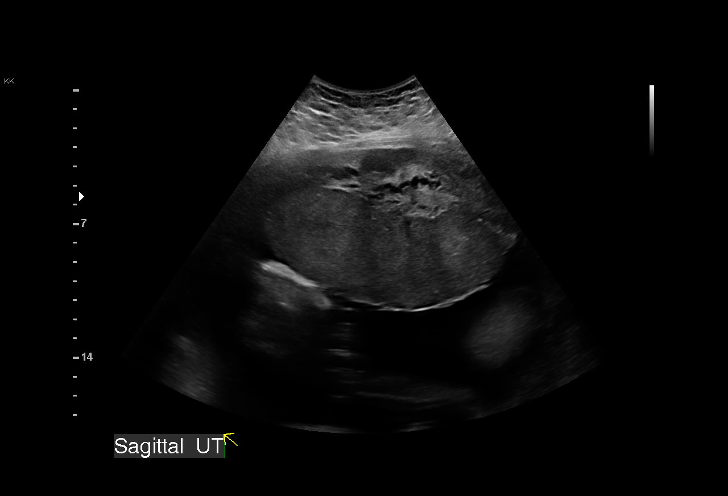
[im 30/43]
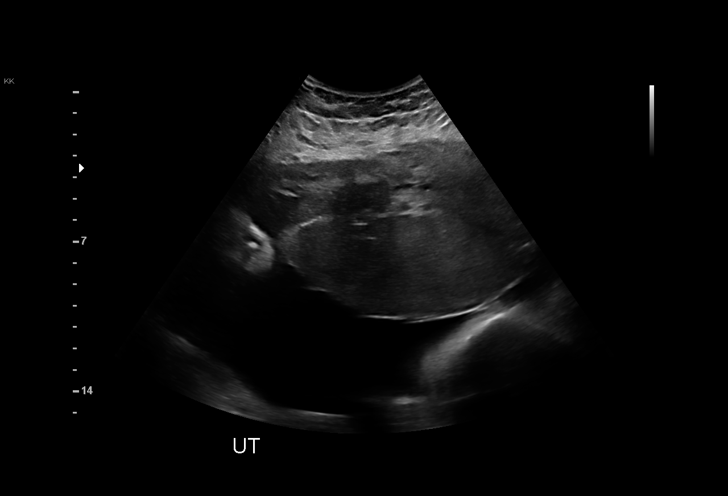
[im 33/43]
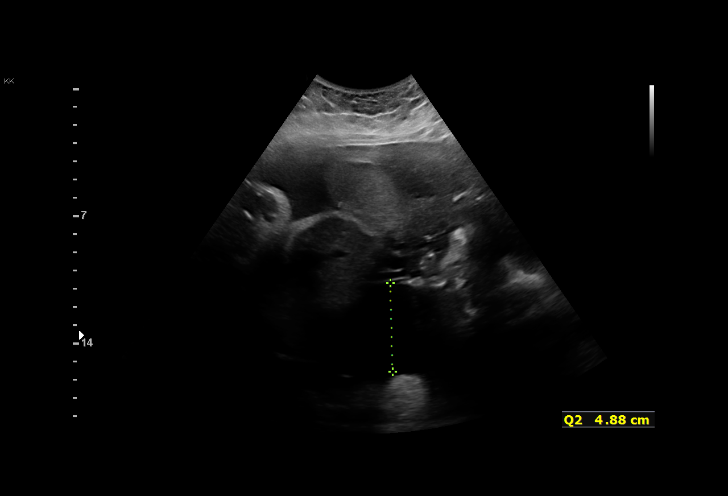
[im 36/43]
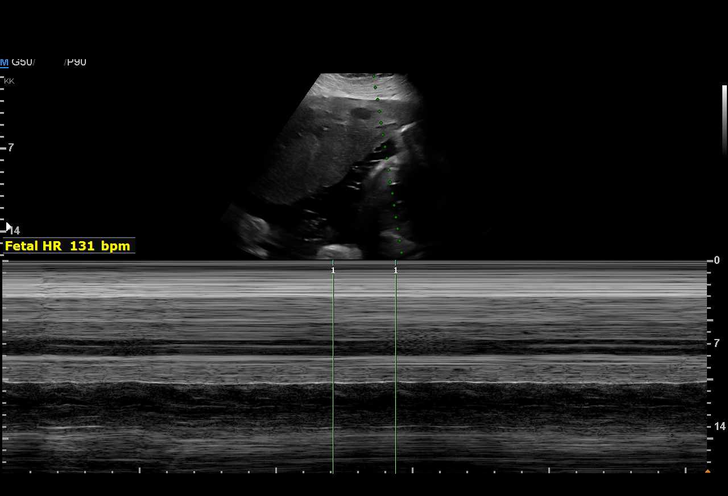
[im 39/43]
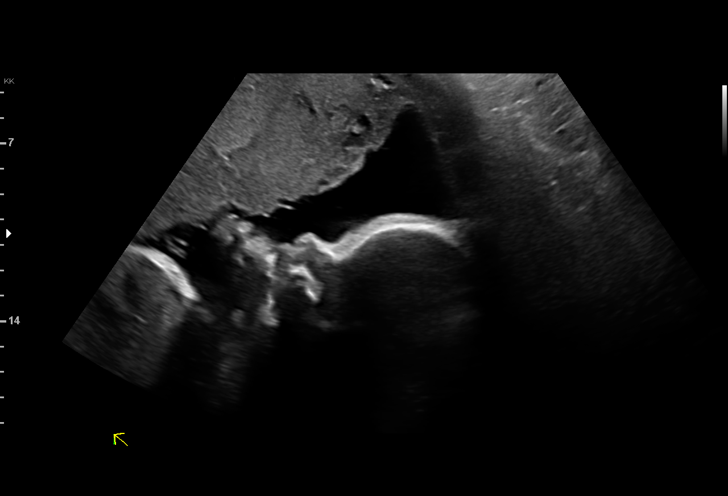
[im 43/43]
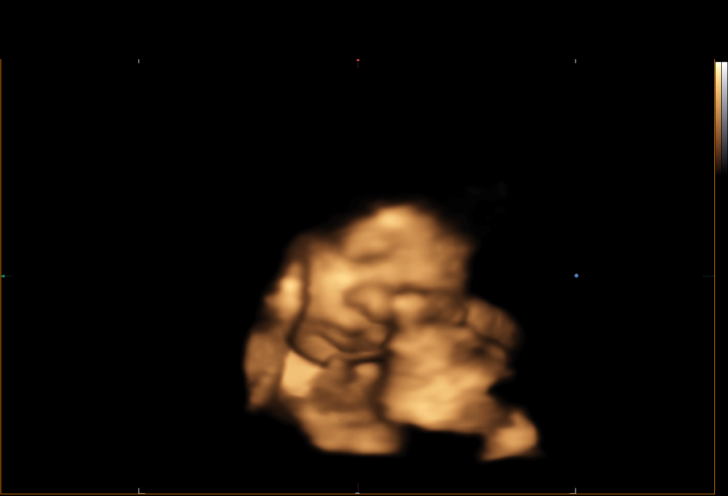

[15 of 28 positions shown; findings below may reference images not displayed]

Indications

 Hypertension - Chronic/Pre-existing
 (labetalol)
 Advanced maternal age multigravida 35+,
 third trimester (low risk NIPS)
 Obesity complicating pregnancy, third
 trimester (pregravid BMI 42)
 Uterine fibroids affecting pregnancy in third  O34.13,
 trimester, antepartum
 Gestational diabetes in pregnancy, diet
 controlled
 33 weeks gestation of pregnancy
Fetal Evaluation

 Num Of Fetuses:         1
 Fetal Heart Rate(bpm):  121
 Cardiac Activity:       Observed
 Presentation:           Cephalic
 Placenta:               Anterior
 P. Cord Insertion:      Previously Visualized

 Amniotic Fluid
 AFI FV:      Subjectively upper-normal

 AFI Sum(cm)     %Tile       Largest Pocket(cm)
 24.96           95

 RUQ(cm)       RLQ(cm)       LUQ(cm)        LLQ(cm)

Biophysical Evaluation

 Amniotic F.V:   Pocket => 2 cm             F. Tone:        Observed
 F. Movement:    Observed                   Score:          [DATE]
 F. Breathing:   Observed
OB History

 Gravidity:    3         Term:   1        Prem:   0        SAB:   1
 TOP:          0       Ectopic:  0        Living: 1
Gestational Age

 LMP:           33w 3d        Date:  12/25/19                 EDD:   09/30/20
 Best:          33w 3d     Det. By:  LMP  (12/25/19)          EDD:   09/30/20
Anatomy

 Diaphragm:             Appears normal         Bladder:                Visualized
 Stomach:               Visualized
Cervix Uterus Adnexa

 Cervix
 Not visualized (advanced GA >35wks)
Comments

 This patient was seen for a biophysical profile due to chronic
 hypertension currently treated with labetalol 300 mg twice a
 day.  She reports that her labetalol dose was just increased
 last week.  Her blood pressures in our office today were
 137/90 and 149/99.  The patient denies any symptoms
 associated with preeclampsia.  She is monitoring her blood
 pressures at home and reports that her blood pressures at
 home have been in the 130s over 90s range.
 A biophysical profile performed today was [DATE].
 Borderline polyhydramnios continues to be noted today.
 Preeclampsia precautions were reviewed with the patient
 today.  She was advised to call should her blood pressures at
 home be persistently greater than 150s over high 90s range
 or should she develop a severe headache.
 The patient should continue weekly fetal testing until delivery.
 Should her blood pressures continue to remain elevated
 despite medication treatment, delivery may be considered at
 around 37 weeks.
 Another biophysical profile scheduled in 1 week.

## 2021-09-26 DIAGNOSIS — Z419 Encounter for procedure for purposes other than remedying health state, unspecified: Secondary | ICD-10-CM | POA: Diagnosis not present

## 2021-10-02 DIAGNOSIS — O1003 Pre-existing essential hypertension complicating the puerperium: Secondary | ICD-10-CM

## 2021-10-02 DIAGNOSIS — I10 Essential (primary) hypertension: Secondary | ICD-10-CM

## 2021-10-03 MED ORDER — HYDROCHLOROTHIAZIDE 25 MG PO TABS: 25.0000 mg | ORAL_TABLET | Freq: Every day | ORAL | 1 refills | Status: DC

## 2021-10-03 MED ORDER — OLMESARTAN MEDOXOMIL 20 MG PO TABS: ORAL_TABLET | ORAL | 1 refills | Status: DC

## 2021-10-27 DIAGNOSIS — Z419 Encounter for procedure for purposes other than remedying health state, unspecified: Secondary | ICD-10-CM | POA: Diagnosis not present

## 2021-11-27 DIAGNOSIS — Z419 Encounter for procedure for purposes other than remedying health state, unspecified: Secondary | ICD-10-CM | POA: Diagnosis not present

## 2021-12-25 DIAGNOSIS — Z419 Encounter for procedure for purposes other than remedying health state, unspecified: Secondary | ICD-10-CM | POA: Diagnosis not present

## 2022-01-25 DIAGNOSIS — Z419 Encounter for procedure for purposes other than remedying health state, unspecified: Secondary | ICD-10-CM | POA: Diagnosis not present

## 2022-02-24 DIAGNOSIS — Z419 Encounter for procedure for purposes other than remedying health state, unspecified: Secondary | ICD-10-CM | POA: Diagnosis not present

## 2022-03-27 DIAGNOSIS — Z419 Encounter for procedure for purposes other than remedying health state, unspecified: Secondary | ICD-10-CM | POA: Diagnosis not present

## 2022-04-26 DIAGNOSIS — Z419 Encounter for procedure for purposes other than remedying health state, unspecified: Secondary | ICD-10-CM | POA: Diagnosis not present

## 2022-05-13 ENCOUNTER — Other Ambulatory Visit: Payer: Self-pay | Admitting: Primary Care

## 2022-05-13 DIAGNOSIS — O1003 Pre-existing essential hypertension complicating the puerperium: Secondary | ICD-10-CM

## 2022-05-13 NOTE — Telephone Encounter (Signed)
Patient is overdue for CPE/follow up, this will be required prior to any further refills.  Please schedule.   

## 2022-05-16 NOTE — Telephone Encounter (Signed)
Called patient set up for CPE. Will call if any questions.

## 2022-05-27 DIAGNOSIS — Z419 Encounter for procedure for purposes other than remedying health state, unspecified: Secondary | ICD-10-CM | POA: Diagnosis not present

## 2022-05-30 ENCOUNTER — Other Ambulatory Visit: Payer: Self-pay | Admitting: Primary Care

## 2022-05-30 DIAGNOSIS — I10 Essential (primary) hypertension: Secondary | ICD-10-CM

## 2022-06-03 ENCOUNTER — Encounter: Payer: Self-pay | Admitting: Primary Care

## 2022-06-03 ENCOUNTER — Ambulatory Visit (INDEPENDENT_AMBULATORY_CARE_PROVIDER_SITE_OTHER): Payer: BC Managed Care – PPO | Admitting: Primary Care

## 2022-06-03 VITALS — BP 124/76 | HR 76 | Temp 98.6°F | Ht 63.0 in | Wt 212.0 lb

## 2022-06-03 DIAGNOSIS — O1003 Pre-existing essential hypertension complicating the puerperium: Secondary | ICD-10-CM | POA: Diagnosis not present

## 2022-06-03 DIAGNOSIS — Z0000 Encounter for general adult medical examination without abnormal findings: Secondary | ICD-10-CM | POA: Diagnosis not present

## 2022-06-03 DIAGNOSIS — Z6837 Body mass index (BMI) 37.0-37.9, adult: Secondary | ICD-10-CM

## 2022-06-03 LAB — COMPREHENSIVE METABOLIC PANEL
ALT: 16 U/L (ref 0–35)
AST: 15 U/L (ref 0–37)
Albumin: 4.2 g/dL (ref 3.5–5.2)
Alkaline Phosphatase: 59 U/L (ref 39–117)
BUN: 14 mg/dL (ref 6–23)
CO2: 25 mEq/L (ref 19–32)
Calcium: 9 mg/dL (ref 8.4–10.5)
Chloride: 102 mEq/L (ref 96–112)
Creatinine, Ser: 0.76 mg/dL (ref 0.40–1.20)
GFR: 100.24 mL/min (ref >60.00)
Glucose, Bld: 97 mg/dL (ref 70–99)
Potassium: 3.7 mEq/L (ref 3.5–5.1)
Sodium: 137 mEq/L (ref 135–145)
Total Bilirubin: 0.4 mg/dL (ref 0.2–1.2)
Total Protein: 7.1 g/dL (ref 6.0–8.3)

## 2022-06-03 LAB — LIPID PANEL
Cholesterol: 176 mg/dL (ref 0–200)
HDL: 44.4 mg/dL (ref >39.00)
LDL Cholesterol: 100 mg/dL — ABNORMAL HIGH (ref 0–99)
NonHDL: 131.72
Total CHOL/HDL Ratio: 4
Triglycerides: 158 mg/dL — ABNORMAL HIGH (ref 0.0–149.0)
VLDL: 31.6 mg/dL (ref 0.0–40.0)

## 2022-06-03 LAB — CBC
HCT: 37.2 % (ref 36.0–46.0)
Hemoglobin: 12.3 g/dL (ref 12.0–15.0)
MCHC: 33.2 g/dL (ref 30.0–36.0)
MCV: 75.9 fl — ABNORMAL LOW (ref 78.0–100.0)
Platelets: 248 10*3/uL (ref 150.0–400.0)
RBC: 4.9 Mil/uL (ref 3.87–5.11)
RDW: 15.4 % (ref 11.5–15.5)
WBC: 5.3 10*3/uL (ref 4.0–10.5)

## 2022-06-03 NOTE — Progress Notes (Signed)
Subjective:    Patient ID: Brandy Mack, female    DOB: 1986-08-11, 36 y.o.   MRN: [id number]  HPI  Brandy Mack is a very pleasant 36 y.o. female who presents today for complete physical and follow up of chronic conditions.   Immunizations: -Tetanus: 2021 -Influenza: Due this season  -Covid-19: Has not completed   Diet: Fair diet.  Exercise: No regular exercise.  Eye exam: Completes annually  Dental exam: Completes semi-annually   Pap Smear: Completed in May 2021  BP Readings from Last 3 Encounters:  06/03/22 124/76  03/08/21 110/62  02/22/21 (!) 145/92       Review of Systems  Constitutional:  Negative for unexpected weight change.  HENT:  Negative for rhinorrhea.   Eyes:  Negative for visual disturbance.  Respiratory:  Negative for cough and shortness of breath.   Cardiovascular:  Negative for chest pain.  Gastrointestinal:  Negative for constipation and diarrhea.  Genitourinary:  Negative for difficulty urinating and menstrual problem.  Musculoskeletal:  Negative for arthralgias and myalgias.  Skin:  Negative for rash.  Allergic/Immunologic: Negative for environmental allergies.  Neurological:  Negative for dizziness, numbness and headaches.  Psychiatric/Behavioral:  The patient is not nervous/anxious.          Past Medical History:  Diagnosis Date   Chronic hypertension with superimposed severe preeclampsia 07/24/2020   '[x]'$  Aspirin 81 mg daily after 12 weeks Current antihypertensives:  Labetalol   Baseline and surveillance labs (pulled in from [facility], refresh links as needed)  Lab Results Component Value Date  PLT 210 07/10/2020  CREATININE 0.57 07/10/2020  AST 15 07/10/2020  ALT 19 07/10/2020   Antenatal Testing CHTN - O10.919  Group I   BP < 140/90, no meds, no preeclampsia, AGA, nml AFV   Group II  BP > 140/90,    Depression    denies   Diet controlled gestational diabetes mellitus in third trimester 07/11/2020   Negative postpartum 2  hr GTT   Elevated blood pressure    Elevated blood pressure reading 05/16/2016   Gestational diabetes    Hypertension    Knee pain, acute 05/16/2016   Positive urine pregnancy test 02/07/2020   Severe preeclampsia, third trimester 08/30/2020    Social History   Socioeconomic History   Marital status: Single    Spouse name: Not on file   Number of children: Not on file   Years of education: Not on file   Highest education level: Not on file  Occupational History   Not on file  Tobacco Use   Smoking status: Former    Packs/day: 0.25    Years: 10.00    Total pack years: 2.50    Types: Cigarettes    Quit date: 01/26/2020    Years since quitting: 2.3   Smokeless tobacco: Never  Vaping Use   Vaping Use: Never used  Substance and Sexual Activity   Alcohol use: Not Currently    Alcohol/week: 0.0 standard drinks of alcohol    Comment: social   Drug use: No   Sexual activity: Yes    Birth control/protection: I.U.D.  Other Topics Concern   Not on file  Social History Narrative   Single.   2 children.   Works at [employer].   Enjoys swimming, reading, spending time with her family.   Social Determinants of Health   Financial Resource Strain: Not on file  Food Insecurity: Not on file  Transportation Needs: Not on file  Physical Activity: Not  on file  Stress: Not on file  Social Connections: Not on file  Intimate Partner Violence: Not on file    Past Surgical History:  Procedure Laterality Date   CESAREAN SECTION N/A 08/31/2020   Procedure: CESAREAN SECTION;  Surgeon: Griffin Basil, MD;  Location: [facility] LD ORS;  Service: Obstetrics;  Laterality: N/A;   IUD INSERTION  12/17/2020       NO PAST SURGERIES      Family History  Adopted: Yes  Problem Relation Age of Onset   Hypertension Father    Stroke Father    Hyperlipidemia Mother     Allergies  Allergen Reactions   Adalat [Nifedipine] Hives   Naproxen Itching and Swelling    Throat swells   Other Hives and  Swelling    Melons cause hives and throat swelling    Current Outpatient Medications on File Prior to Visit  Medication Sig Dispense Refill   hydrochlorothiazide (HYDRODIURIL) 25 MG tablet Take 1 tablet (25 mg total) by mouth daily. For blood pressure 90 tablet 1   olmesartan (BENICAR) 20 MG tablet TAKE 1 TABLET BY MOUTH DAILY FOR BLOOD PRESSURE. Office visit required for further refills. 30 tablet 0   PARAGARD INTRAUTERINE COPPER IU by Intrauterine route.     No current facility-administered medications on file prior to visit.    BP 124/76   Pulse 76   Temp 98.6 F (37 C) (Oral)   Ht '5\' 3"'$  (1.6 m)   Wt 212 lb (96.2 kg)   SpO2 98%   BMI 37.55 kg/m  Objective:   Physical Exam HENT:     Right Ear: Tympanic membrane and ear canal normal.     Left Ear: Tympanic membrane and ear canal normal.     Nose: Nose normal.  Eyes:     Conjunctiva/sclera: Conjunctivae normal.     Pupils: Pupils are equal, round, and reactive to light.  Neck:     Thyroid: No thyromegaly.  Cardiovascular:     Rate and Rhythm: Normal rate and regular rhythm.     Heart sounds: No murmur heard. Pulmonary:     Effort: Pulmonary effort is normal.     Breath sounds: Normal breath sounds. No rales.  Abdominal:     General: Bowel sounds are normal.     Palpations: Abdomen is soft.     Tenderness: There is no abdominal tenderness.  Musculoskeletal:        General: Normal range of motion.     Cervical back: Neck supple.  Lymphadenopathy:     Cervical: No cervical adenopathy.  Skin:    General: Skin is warm and dry.     Findings: No rash.  Neurological:     Mental Status: She is alert and oriented to person, place, and time.     Cranial Nerves: No cranial nerve deficit.     Deep Tendon Reflexes: Reflexes are normal and symmetric.  Psychiatric:        Mood and Affect: Mood normal.           Assessment & Plan:   Problem List Items Addressed This Visit       Cardiovascular and Mediastinum    Benign essential hypertension, postpartum    Controlled.  Continue olmesartan 20 mg daily, HCTZ 25 mg daily.  CMP pending.      Relevant Orders   Lipid panel   Comprehensive metabolic panel   CBC     Other   Preventative health care - Primary  Immunizations UTD. Pap smear UTD   Discussed the importance of a healthy diet and regular exercise in order for weight loss, and to reduce the risk of further co-morbidity.  Exam stable. Labs pending.  Follow up in 1 year for repeat physical.       Class 2 obesity due to excess calories with body mass index (BMI) of 37.0 to 37.9 in adult    Discussed the importance of a healthy diet and regular exercise in order for weight loss, and to reduce the risk of further co-morbidity.            Pleas Koch, NP

## 2022-06-03 NOTE — Assessment & Plan Note (Signed)
Immunizations UTD. Pap smear UTD   Discussed the importance of a healthy diet and regular exercise in order for weight loss, and to reduce the risk of further co-morbidity.  Exam stable. Labs pending.  Follow up in 1 year for repeat physical.

## 2022-06-03 NOTE — Assessment & Plan Note (Signed)
Controlled.  Continue olmesartan 20 mg daily, HCTZ 25 mg daily.  CMP pending.

## 2022-06-03 NOTE — Patient Instructions (Signed)
Stop by the lab prior to leaving today. I will notify you of your results once received.   It was a pleasure to see you today!  Preventive Care 21-37 Years Old, Female Preventive care refers to lifestyle choices and visits with your health care provider that can promote health and wellness. Preventive care visits are also called wellness exams. What can I expect for my preventive care visit? Counseling During your preventive care visit, your health care provider may ask about your: Medical history, including: Past medical problems. Family medical history. Pregnancy history. Current health, including: Menstrual cycle. Method of birth control. Emotional well-being. Home life and relationship well-being. Sexual activity and sexual health. Lifestyle, including: Alcohol, nicotine or tobacco, and drug use. Access to firearms. Diet, exercise, and sleep habits. Work and work environment. Sunscreen use. Safety issues such as seatbelt and bike helmet use. Physical exam Your health care provider may check your: Height and weight. These may be used to calculate your BMI (body mass index). BMI is a measurement that tells if you are at a healthy weight. Waist circumference. This measures the distance around your waistline. This measurement also tells if you are at a healthy weight and may help predict your risk of certain diseases, such as type 2 diabetes and high blood pressure. Heart rate and blood pressure. Body temperature. Skin for abnormal spots. What immunizations do I need?  Vaccines are usually given at various ages, according to a schedule. Your health care provider will recommend vaccines for you based on your age, medical history, and lifestyle or other factors, such as travel or where you work. What tests do I need? Screening Your health care provider may recommend screening tests for certain conditions. This may include: Pelvic exam and Pap test. Lipid and cholesterol  levels. Diabetes screening. This is done by checking your blood sugar (glucose) after you have not eaten for a while (fasting). Hepatitis B test. Hepatitis C test. HIV (human immunodeficiency virus) test. STI (sexually transmitted infection) testing, if you are at risk. BRCA-related cancer screening. This may be done if you have a family history of breast, ovarian, tubal, or peritoneal cancers. Talk with your health care provider about your test results, treatment options, and if necessary, the need for more tests. Follow these instructions at home: Eating and drinking  Eat a healthy diet that includes fresh fruits and vegetables, whole grains, lean protein, and low-fat dairy products. Take vitamin and mineral supplements as recommended by your health care provider. Do not drink alcohol if: Your health care provider tells you not to drink. You are pregnant, may be pregnant, or are planning to become pregnant. If you drink alcohol: Limit how much you have to 0-1 drink a day. Know how much alcohol is in your drink. In the [place] one drink equals one 12 oz bottle of beer (355 mL), one 5 oz glass of wine (148 mL), or one 1 oz glass of hard liquor (44 mL). Lifestyle Brush your teeth every morning and night with fluoride toothpaste. Floss one time each day. Exercise for at least 30 minutes 5 or more days each week. Do not use any products that contain nicotine or tobacco. These products include cigarettes, chewing tobacco, and vaping devices, such as e-cigarettes. If you need help quitting, ask your health care provider. Do not use drugs. If you are sexually active, practice safe sex. Use a condom or other form of protection to prevent STIs. If you do not wish to become pregnant, use a   form of birth control. If you plan to become pregnant, see your health care provider for a prepregnancy visit. Find healthy ways to manage stress, such as: Meditation, yoga, or listening to  music. Journaling. Talking to a trusted person. Spending time with friends and family. Minimize exposure to UV radiation to reduce your risk of skin cancer. Safety Always wear your seat belt while driving or riding in a vehicle. Do not drive: If you have been drinking alcohol. Do not ride with someone who has been drinking. If you have been using any mind-altering substances or drugs. While texting. When you are tired or distracted. Wear a helmet and other protective equipment during sports activities. If you have firearms in your house, make sure you follow all gun safety procedures. Seek help if you have been physically or sexually abused. What's next? Go to your health care provider once a year for an annual wellness visit. Ask your health care provider how often you should have your eyes and teeth checked. Stay up to date on all vaccines. This information is not intended to replace advice given to you by your health care provider. Make sure you discuss any questions you have with your health care provider. Document Revised: 04/10/2021 Document Reviewed: 04/10/2021 Elsevier Patient Education  [place].

## 2022-06-03 NOTE — Assessment & Plan Note (Signed)
Discussed the importance of a healthy diet and regular exercise in order for weight loss, and to reduce the risk of further co-morbidity.  

## 2022-06-26 ENCOUNTER — Other Ambulatory Visit: Payer: Self-pay | Admitting: Primary Care

## 2022-06-26 DIAGNOSIS — O1003 Pre-existing essential hypertension complicating the puerperium: Secondary | ICD-10-CM

## 2022-06-27 DIAGNOSIS — Z419 Encounter for procedure for purposes other than remedying health state, unspecified: Secondary | ICD-10-CM | POA: Diagnosis not present

## 2022-07-17 ENCOUNTER — Telehealth (INDEPENDENT_AMBULATORY_CARE_PROVIDER_SITE_OTHER): Payer: BC Managed Care – PPO | Admitting: Family Medicine

## 2022-07-17 VITALS — Temp 97.7°F

## 2022-07-17 DIAGNOSIS — U071 COVID-19: Secondary | ICD-10-CM | POA: Diagnosis not present

## 2022-07-17 HISTORY — DX: COVID-19: U07.1

## 2022-07-17 MED ORDER — NIRMATRELVIR/RITONAVIR (PAXLOVID)TABLET: 3.0000 | ORAL_TABLET | Freq: Two times a day (BID) | ORAL | 0 refills | Status: AC

## 2022-07-17 NOTE — Progress Notes (Signed)
VIRTUAL VISIT A virtual visit is felt to be most appropriate for this patient at this time.   I connected with the patient on 07/17/22 at 12:00 PM EDT by virtual telehealth platform and verified that I am speaking with the correct person using two identifiers.   I discussed the limitations, risks, security and privacy concerns of performing an evaluation and management service by  virtual telehealth platform and the availability of in person appointments. I also discussed with the patient that there may be a patient responsible charge related to this service. The patient expressed understanding and agreed to proceed.  Patient location: Home Provider Location: [facility] Participants: Guillermina Shaft Diona Browner and Tylynn Braniff   Chief Complaint  Patient presents with   Covid Positive    On 07/17/22, sx started on 07/16/22-tired, around 1 pm yesterday started having body aches, this morning really stuffy and achy feeling.     History of Present Illness: 36 year old female patient with history of hypertension presents with COVID infection. Date of onset 07/16/2022  Initial symptoms started with fatigue and body aches.  Today she reports  progression to nasal congestion, sinus pressure. Post nasal drip.  No cough, no ear pain, no ST.  No SOB, no wheeze. No fever.   She is using advil... this helps  a lot.   Good po intake.   GFR > 100   COVID 19 screen COVID testing: Positive on 07/17/2022 COVID vaccine: None COVID exposure: No recent travel or known exposure to [place]  The importance of social distancing was discussed today.    Review of Systems  Constitutional:  Negative for chills and fever.  HENT:  Positive for congestion. Negative for ear pain.   Eyes:  Negative for pain and redness.  Respiratory:  Positive for cough. Negative for shortness of breath.   Cardiovascular:  Negative for chest pain, palpitations and leg swelling.  Gastrointestinal:  Negative for  abdominal pain, blood in stool, constipation, diarrhea, nausea and vomiting.  Genitourinary:  Negative for dysuria.  Musculoskeletal:  Negative for falls and myalgias.  Skin:  Negative for rash.  Neurological:  Negative for dizziness.  Psychiatric/Behavioral:  Negative for depression. The patient is not nervous/anxious.       Past Medical History:  Diagnosis Date   Chronic hypertension with superimposed severe preeclampsia 07/24/2020   '[x]'$  Aspirin 81 mg daily after 12 weeks Current antihypertensives:  Labetalol   Baseline and surveillance labs (pulled in from [facility], refresh links as needed)  Lab Results Component Value Date  PLT 210 07/10/2020  CREATININE 0.57 07/10/2020  AST 15 07/10/2020  ALT 19 07/10/2020   Antenatal Testing CHTN - O10.919  Group I   BP < 140/90, no meds, no preeclampsia, AGA, nml AFV   Group II  BP > 140/90,    Depression    denies   Diet controlled gestational diabetes mellitus in third trimester 07/11/2020   Negative postpartum 2 hr GTT   Elevated blood pressure    Elevated blood pressure reading 05/16/2016   Gestational diabetes    Hypertension    Knee pain, acute 05/16/2016   Positive urine pregnancy test 02/07/2020   Severe preeclampsia, third trimester 08/30/2020    reports that she quit smoking about 2 years ago. Her smoking use included cigarettes. She has a 2.50 pack-year smoking history. She has never used smokeless tobacco. She reports that she does not currently use alcohol. She reports that she does not use drugs.   Current Outpatient Medications:  hydrochlorothiazide (HYDRODIURIL) 25 MG tablet, TAKE 1 TABLET BY MOUTH DAILY FOR BLOOD PRESSURE, Disp: 90 tablet, Rfl: 3   olmesartan (BENICAR) 20 MG tablet, TAKE 1 TABLET BY MOUTH DAILY FOR BLOOD PRESSURE, Disp: 90 tablet, Rfl: 3   PARAGARD INTRAUTERINE COPPER IU, by Intrauterine route., Disp: , Rfl:    Observations/Objective: Temperature 97.7 F (36.5 C), currently breastfeeding.  Physical Exam  Physical  Exam Constitutional:      General: The patient is not in acute distress. Pulmonary:     Effort: Pulmonary effort is normal. No respiratory distress.  Neurological:     Mental Status: The patient is alert and oriented to person, place, and time.  Psychiatric:        Mood and Affect: Mood normal.        Behavior: Behavior normal.   Assessment and Plan Problem List Items Addressed This Visit     COVID-19 - Primary    COVID19  Infection < 5 days from onset of symptoms in un-vaccinated overweight individual with history of  HTN  No clear sign of bacterial infection at this time.   No SOB.  No red flags/need for ER visit or in-person exam at respiratory clinic at this time..    Pt higher risk for COVID complications given  HTN. GFR  100 and no medication contraindications.  Start paxlovid 5 day course. Reviewed course of medication and side effect profile with patient in detail.   Symptomatic care with mucinex and cough suppressant at night. If SOB begins symptoms worsening.. have low threshold for in-person exam, if severe shortness of breath ER visit recommended.  Can monitor Oxygen saturation at home with home monitor if able to obtain.  Go to ER if O2 sat < 90% on room air.   Reviewed home care and provided information through [place].  Recommended quarantine 5 days isolation recommended. Return to work day 6 and wear mask for 4 more days to complete 10 days. Provided info about prevention of spread of COVID 19.       Relevant Medications   nirmatrelvir/ritonavir EUA (PAXLOVID) 20 x 150 MG & 10 x '100MG'$  TABS      I discussed the assessment and treatment plan with the patient. The patient was provided an opportunity to ask questions and all were answered. The patient agreed with the plan and demonstrated an understanding of the instructions.   The patient was advised to call back or seek an in-person evaluation if the symptoms worsen or if the condition fails to improve as  anticipated.     Eliezer Lofts, MD

## 2022-07-17 NOTE — Telephone Encounter (Signed)
Called patient added on to see Dr. Diona Browner today virtual. No further action needed at this time.

## 2022-07-17 NOTE — Assessment & Plan Note (Signed)
COVID19  Infection < 5 days from onset of symptoms in un-vaccinated overweight individual with history of  HTN  No clear sign of bacterial infection at this time.   No SOB.  No red flags/need for ER visit or in-person exam at respiratory clinic at this time..    Pt higher risk for COVID complications given  HTN. GFR  100 and no medication contraindications.  Start paxlovid 5 day course. Reviewed course of medication and side effect profile with patient in detail.   Symptomatic care with mucinex and cough suppressant at night. If SOB begins symptoms worsening.. have low threshold for in-person exam, if severe shortness of breath ER visit recommended.  Can monitor Oxygen saturation at home with home monitor if able to obtain.  Go to ER if O2 sat < 90% on room air.   Reviewed home care and provided information through [place].  Recommended quarantine 5 days isolation recommended. Return to work day 6 and wear mask for 4 more days to complete 10 days. Provided info about prevention of spread of COVID 19.

## 2022-07-27 DIAGNOSIS — Z419 Encounter for procedure for purposes other than remedying health state, unspecified: Secondary | ICD-10-CM | POA: Diagnosis not present

## 2022-08-27 DIAGNOSIS — Z419 Encounter for procedure for purposes other than remedying health state, unspecified: Secondary | ICD-10-CM | POA: Diagnosis not present

## 2022-09-26 DIAGNOSIS — Z419 Encounter for procedure for purposes other than remedying health state, unspecified: Secondary | ICD-10-CM | POA: Diagnosis not present

## 2022-10-27 DIAGNOSIS — Z419 Encounter for procedure for purposes other than remedying health state, unspecified: Secondary | ICD-10-CM | POA: Diagnosis not present

## 2022-10-30 ENCOUNTER — Ambulatory Visit (INDEPENDENT_AMBULATORY_CARE_PROVIDER_SITE_OTHER): Payer: BC Managed Care – PPO | Admitting: Family Medicine

## 2022-10-30 ENCOUNTER — Encounter: Payer: Self-pay | Admitting: Family Medicine

## 2022-10-30 VITALS — BP 116/78 | HR 87 | Temp 99.0°F | Resp 16 | Ht 63.0 in | Wt 216.0 lb

## 2022-10-30 DIAGNOSIS — J208 Acute bronchitis due to other specified organisms: Secondary | ICD-10-CM | POA: Diagnosis not present

## 2022-10-30 DIAGNOSIS — R52 Pain, unspecified: Secondary | ICD-10-CM

## 2022-10-30 LAB — POC COVID19 BINAXNOW: SARS Coronavirus 2 Ag: NEGATIVE

## 2022-10-30 MED ORDER — PREDNISONE 20 MG PO TABS: ORAL_TABLET | ORAL | 0 refills | Status: DC

## 2022-10-30 MED ORDER — ALBUTEROL SULFATE HFA 108 (90 BASE) MCG/ACT IN AERS: 2.0000 | INHALATION_SPRAY | Freq: Four times a day (QID) | RESPIRATORY_TRACT | 2 refills | Status: DC | PRN

## 2022-10-30 NOTE — Assessment & Plan Note (Signed)
Acute, most likely RSV bronchitis.  Will test for COVID, RSV and flu. Recommended rest, fluids and time .  Given the wheezing will start her on prednisone taper and give her prescription for albuterol to use as needed.  Remain home until 24 hours after fever is resolved off of antipyretics.

## 2022-10-30 NOTE — Progress Notes (Signed)
Patient ID: Brandy Mack, female    DOB: May 12, 1986, 37 y.o.   MRN: [id number]  This visit was conducted in person.  BP 116/78   Pulse 87   Temp (!) 96.5 F (35.8 C)   Resp 16   Ht '5\' 3"'$  (1.6 m)   Wt 216 lb (98 kg)   SpO2 99%   Breastfeeding No   BMI 38.26 kg/m    CC:  Chief Complaint  Patient presents with   Fatigue    X 4 days   Generalized Body Aches    Subjective:   HPI: Brandy Mack is a 37 y.o. female with HTN presenting on 10/30/2022 for Fatigue (X 4 days) and Generalized Body Aches   Date of onset: 5 days ago Initial symptoms included  fatigue, body aches Symptoms progressed to chest congestion, sneeze, cold chills, no fever but felt feverish. Cough, dry moved to productive. No ST.  Very tired.  Feels SOB at rest.  Occ wheeze.  Sent home from work.   Sick contacts: RSV at work, niece with the flu.  COVID testing:   none    She has tried to treat with  mucinex, cold and flu tablet.     No history of chronic lung disease such as asthma or COPD. Non-smoker.  Treated for COVID July 17, 2022 with Paxlovid.      Relevant past medical, surgical, family and social history reviewed and updated as indicated. Interim medical history since our last visit reviewed. Allergies and medications reviewed and updated. Outpatient Medications Prior to Visit  Medication Sig Dispense Refill   hydrochlorothiazide (HYDRODIURIL) 25 MG tablet TAKE 1 TABLET BY MOUTH DAILY FOR BLOOD PRESSURE 90 tablet 3   olmesartan (BENICAR) 20 MG tablet TAKE 1 TABLET BY MOUTH DAILY FOR BLOOD PRESSURE 90 tablet 3   PARAGARD INTRAUTERINE COPPER IU by Intrauterine route.     No facility-administered medications prior to visit.     Per HPI unless specifically indicated in ROS section below Review of Systems  Constitutional:  Negative for fatigue and fever.  HENT:  Negative for congestion.   Eyes:  Negative for pain.  Respiratory:  Negative for cough and  shortness of breath.   Cardiovascular:  Negative for chest pain, palpitations and leg swelling.  Gastrointestinal:  Negative for abdominal pain.  Genitourinary:  Negative for dysuria and vaginal bleeding.  Musculoskeletal:  Negative for back pain.  Neurological:  Negative for syncope, light-headedness and headaches.  Psychiatric/Behavioral:  Negative for dysphoric mood.    Objective:  BP 116/78   Pulse 87   Temp (!) 96.5 F (35.8 C)   Resp 16   Ht '5\' 3"'$  (1.6 m)   Wt 216 lb (98 kg)   SpO2 99%   Breastfeeding No   BMI 38.26 kg/m   Wt Readings from Last 3 Encounters:  10/30/22 216 lb (98 kg)  06/03/22 212 lb (96.2 kg)  03/08/21 215 lb (97.5 kg)      Physical Exam Constitutional:      General: She is not in acute distress.    Appearance: Normal appearance. She is well-developed. She is not ill-appearing or toxic-appearing.  HENT:     Head: Normocephalic.     Right Ear: Hearing, tympanic membrane, ear canal and external ear normal. Tympanic membrane is not erythematous, retracted or bulging.     Left Ear: Hearing, tympanic membrane, ear canal and external ear normal. Tympanic membrane is not erythematous, retracted or bulging.  Nose: No mucosal edema or rhinorrhea.     Right Sinus: No maxillary sinus tenderness or frontal sinus tenderness.     Left Sinus: No maxillary sinus tenderness or frontal sinus tenderness.     Mouth/Throat:     Pharynx: Uvula midline.  Eyes:     General: Lids are normal. Lids are everted, no foreign bodies appreciated.     Conjunctiva/sclera: Conjunctivae normal.     Pupils: Pupils are equal, round, and reactive to light.  Neck:     Thyroid: No thyroid mass or thyromegaly.     Vascular: No carotid bruit.     Trachea: Trachea normal.  Cardiovascular:     Rate and Rhythm: Normal rate and regular rhythm.     Pulses: Normal pulses.     Heart sounds: Normal heart sounds, S1 normal and S2 normal. No murmur heard.    No friction rub. No gallop.   Pulmonary:     Effort: Pulmonary effort is normal. No tachypnea or respiratory distress.     Breath sounds: Wheezing present. No decreased breath sounds, rhonchi or rales.  Abdominal:     General: Bowel sounds are normal.     Palpations: Abdomen is soft.     Tenderness: There is no abdominal tenderness.  Musculoskeletal:     Cervical back: Normal range of motion and neck supple.  Skin:    General: Skin is warm and dry.     Findings: No rash.  Neurological:     Mental Status: She is alert.  Psychiatric:        Mood and Affect: Mood is not anxious or depressed.        Speech: Speech normal.        Behavior: Behavior normal. Behavior is cooperative.        Thought Content: Thought content normal.        Judgment: Judgment normal.       Results for orders placed or performed in visit on 06/03/22  Lipid panel  Result Value Ref Range   Cholesterol 176 0 - 200 mg/dL   Triglycerides 158.0 (H) 0.0 - 149.0 mg/dL   HDL 44.40 >39.00 mg/dL   VLDL 31.6 0.0 - 40.0 mg/dL   LDL Cholesterol 100 (H) 0 - 99 mg/dL   Total CHOL/HDL Ratio 4    NonHDL 131.72   Comprehensive metabolic panel  Result Value Ref Range   Sodium 137 135 - 145 mEq/L   Potassium 3.7 3.5 - 5.1 mEq/L   Chloride 102 96 - 112 mEq/L   CO2 25 19 - 32 mEq/L   Glucose, Bld 97 70 - 99 mg/dL   BUN 14 6 - 23 mg/dL   Creatinine, Ser 0.76 0.40 - 1.20 mg/dL   Total Bilirubin 0.4 0.2 - 1.2 mg/dL   Alkaline Phosphatase 59 39 - 117 U/L   AST 15 0 - 37 U/L   ALT 16 0 - 35 U/L   Total Protein 7.1 6.0 - 8.3 g/dL   Albumin 4.2 3.5 - 5.2 g/dL   GFR 100.24 >60.00 mL/min   Calcium 9.0 8.4 - 10.5 mg/dL  CBC  Result Value Ref Range   WBC 5.3 4.0 - 10.5 K/uL   RBC 4.90 3.87 - 5.11 Mil/uL   Platelets 248.0 150.0 - 400.0 K/uL   Hemoglobin 12.3 12.0 - 15.0 g/dL   HCT 37.2 36.0 - 46.0 %   MCV 75.9 (L) 78.0 - 100.0 fl   MCHC 33.2 30.0 - 36.0 g/dL   RDW  15.4 11.5 - 15.5 %     COVID 19 screen:  No recent travel or known exposure to  [place] The patient denies respiratory symptoms of COVID 19 at this time. The importance of social distancing was discussed today.   Assessment and Plan  Problem List Items Addressed This Visit     Acute viral bronchitis    Acute, most likely RSV bronchitis.  Will test for COVID, RSV and flu. Recommended rest, fluids and time .  Given the wheezing will start her on prednisone taper and give her prescription for albuterol to use as needed.  Remain home until 24 hours after fever is resolved off of antipyretics.      Other Visit Diagnoses     Body aches    -  Primary   Relevant Orders   COVID-19, Flu A+B and RSV   POC COVID-19 BinaxNow      No clear sign of current bacterial infection.  Eliezer Lofts, MD

## 2022-10-30 NOTE — Patient Instructions (Signed)
Rest, push fluids. Start prednisone taper in the morning on November 5.  Can use albuterol inhaler 2 puffs every 4-6 hours as needed for wheeze or shortness of breath.  Go to the emergency room if severe shortness of breath.

## 2022-10-31 LAB — COVID-19, FLU A+B AND RSV
Influenza A, NAA: NOT DETECTED
Influenza B, NAA: NOT DETECTED
RSV, NAA: NOT DETECTED
SARS-CoV-2, NAA: NOT DETECTED

## 2022-11-27 DIAGNOSIS — Z419 Encounter for procedure for purposes other than remedying health state, unspecified: Secondary | ICD-10-CM | POA: Diagnosis not present

## 2022-12-26 DIAGNOSIS — Z419 Encounter for procedure for purposes other than remedying health state, unspecified: Secondary | ICD-10-CM | POA: Diagnosis not present

## 2023-01-08 ENCOUNTER — Encounter: Payer: Self-pay | Admitting: *Deleted

## 2023-01-08 ENCOUNTER — Ambulatory Visit (INDEPENDENT_AMBULATORY_CARE_PROVIDER_SITE_OTHER): Payer: BC Managed Care – PPO | Admitting: Family Medicine

## 2023-01-08 ENCOUNTER — Encounter: Payer: Self-pay | Admitting: Family Medicine

## 2023-01-08 VITALS — BP 156/90 | HR 73 | Temp 97.5°F | Ht 63.0 in | Wt 214.5 lb

## 2023-01-08 DIAGNOSIS — I10 Essential (primary) hypertension: Secondary | ICD-10-CM

## 2023-01-08 DIAGNOSIS — N644 Mastodynia: Secondary | ICD-10-CM | POA: Insufficient documentation

## 2023-01-08 DIAGNOSIS — R21 Rash and other nonspecific skin eruption: Secondary | ICD-10-CM | POA: Insufficient documentation

## 2023-01-08 MED ORDER — TRIAMCINOLONE ACETONIDE 0.1 % EX CREA: 1.0000 | TOPICAL_CREAM | Freq: Two times a day (BID) | CUTANEOUS | 0 refills | Status: DC

## 2023-01-08 NOTE — Patient Instructions (Addendum)
Get a new blood pressure cuff.. follow blood pressure every few days.. call if is persistently > 140/90.  We will set up mammogram and [place].  Apply topical steroid cream.  Change to hypoallergenic soap and detergent. Stop any lotions.

## 2023-01-08 NOTE — Assessment & Plan Note (Signed)
Acute, will evaluate with diagnostic mammogram and bilateral ultrasound.

## 2023-01-08 NOTE — Assessment & Plan Note (Addendum)
Encouraged her to continue to follow blood pressure at home and call if numbers greater than 140/90.  Blood pressure likely initially elevated today in office given anxiety about breast changes and stress at work.  She did states she did not take the hydrochlorothiazide and olmesartan this morning.

## 2023-01-08 NOTE — Progress Notes (Signed)
Patient ID: Brandy Mack, female    DOB: [birth year], 37 y.o.   MRN: [id number]  This visit was conducted in person.  BP (!) 142/90   Pulse 73   Temp (!) 97.5 F (36.4 C) (Temporal)   Ht '5\' 3"'$  (1.6 m)   Wt 214 lb 8 oz (97.3 kg)   SpO2 99%   BMI 38.00 kg/m    CC:  Chief Complaint  Patient presents with   Rash    On Breast-Weeping   Breast Pain    With Red Marks and Itch under the skin   Headache    X 2 days that won't go away    Subjective:   HPI: Brandy Mack is a 37 y.o. female presenting on 01/08/2023 for Rash (On Breast-Weeping), Breast Pain (With Red Marks and Itch under the skin), and Headache (X 2 days that won't go away)  She has noted new onset rash on right  areola 1 month ago. Flaky, dry itchy  Few days later noted similar rash on left areola.  Left resolved but right has not improving... still irritated, clear yellowish liquids, no odor.  Are on right breast has increasing in size to quarter size.   Also feels heaviness and pain in right lateral breast.   No  breast lumps or bumps.   2 days ago noted red bumps occur on rest of breast.. itchy.  Has been treating with [employer] petroleum jelly ointment..    2019 mammogram diagnostic for breast pain.Marland Kitchen normal.  Reviewed.   Family history:  no history of breast cancer, but Dad adopted.   Has had some headache.. increased stress at work  Hx of postpartum HTN.. baby now 2, no diagnosis of hypertension on chart but she is on olmesartan and hydrochlorothiazide.  Reviewed last office visit with PCP. BP Readings from Last 3 Encounters:  01/08/23 (!) 142/90  10/30/22 116/78  06/03/22 124/76    Did start new Dial antibacterial sop fe w months ago prior to rash.  Relevant past medical, surgical, family and social history reviewed and updated as indicated. Interim medical history since our last visit reviewed. Allergies and medications reviewed and updated. Outpatient Medications Prior to Visit   Medication Sig Dispense Refill   hydrochlorothiazide (HYDRODIURIL) 25 MG tablet TAKE 1 TABLET BY MOUTH DAILY FOR BLOOD PRESSURE 90 tablet 3   olmesartan (BENICAR) 20 MG tablet TAKE 1 TABLET BY MOUTH DAILY FOR BLOOD PRESSURE 90 tablet 3   PARAGARD INTRAUTERINE COPPER IU by Intrauterine route.     albuterol (VENTOLIN HFA) 108 (90 Base) MCG/ACT inhaler Inhale 2 puffs into the lungs every 6 (six) hours as needed for wheezing or shortness of breath. 8 g 2   predniSONE (DELTASONE) 20 MG tablet 3 tabs by mouth daily x 3 days, then 2 tabs by mouth daily x 2 days then 1 tab by mouth daily x 2 days 15 tablet 0   No facility-administered medications prior to visit.     Per HPI unless specifically indicated in ROS section below Review of Systems  Constitutional:  Negative for fatigue and fever.  HENT:  Negative for congestion.   Eyes:  Negative for pain.  Respiratory:  Negative for cough and shortness of breath.   Cardiovascular:  Negative for chest pain, palpitations and leg swelling.  Gastrointestinal:  Negative for abdominal pain.  Genitourinary:  Negative for dysuria and vaginal bleeding.  Musculoskeletal:  Negative for back pain.  Neurological:  Negative for syncope, light-headedness and  headaches.  Psychiatric/Behavioral:  Negative for dysphoric mood.    Objective:  BP (!) 142/90   Pulse 73   Temp (!) 97.5 F (36.4 C) (Temporal)   Ht '5\' 3"'$  (1.6 m)   Wt 214 lb 8 oz (97.3 kg)   SpO2 99%   BMI 38.00 kg/m   Wt Readings from Last 3 Encounters:  01/08/23 214 lb 8 oz (97.3 kg)  10/30/22 216 lb (98 kg)  06/03/22 212 lb (96.2 kg)      Physical Exam Exam conducted with a chaperone present.  Constitutional:      General: She is not in acute distress.    Appearance: Normal appearance. She is well-developed. She is not ill-appearing or toxic-appearing.  HENT:     Head: Normocephalic.     Right Ear: Hearing, tympanic membrane, ear canal and external ear normal. Tympanic membrane is not  erythematous, retracted or bulging.     Left Ear: Hearing, tympanic membrane, ear canal and external ear normal. Tympanic membrane is not erythematous, retracted or bulging.     Nose: No mucosal edema or rhinorrhea.     Right Sinus: No maxillary sinus tenderness or frontal sinus tenderness.     Left Sinus: No maxillary sinus tenderness or frontal sinus tenderness.     Mouth/Throat:     Pharynx: Uvula midline.  Eyes:     General: Lids are normal. Lids are everted, no foreign bodies appreciated.     Conjunctiva/sclera: Conjunctivae normal.     Pupils: Pupils are equal, round, and reactive to light.  Neck:     Thyroid: No thyroid mass or thyromegaly.     Vascular: No carotid bruit.     Trachea: Trachea normal.  Cardiovascular:     Rate and Rhythm: Normal rate and regular rhythm.     Pulses: Normal pulses.     Heart sounds: Normal heart sounds, S1 normal and S2 normal. No murmur heard.    No friction rub. No gallop.  Pulmonary:     Effort: Pulmonary effort is normal. No tachypnea or respiratory distress.     Breath sounds: Normal breath sounds. No decreased breath sounds, wheezing, rhonchi or rales.  Chest:  Breasts:    Breasts are symmetrical.     Right: Skin change present. No swelling, bleeding, inverted nipple, mass or tenderness.     Left: Skin change present. No swelling, bleeding, inverted nipple, mass or tenderness.     Comments: Dry flaky irritated skin at 10:00 on right nipple, scattered erythematous patches across bilateral breasts4  No lymphadenopathy noted in bilateral axilla Abdominal:     General: Bowel sounds are normal.     Palpations: Abdomen is soft.     Tenderness: There is no abdominal tenderness.  Musculoskeletal:     Cervical back: Normal range of motion and neck supple.  Skin:    General: Skin is warm and dry.     Findings: No rash.  Neurological:     Mental Status: She is alert.  Psychiatric:        Mood and Affect: Mood is not anxious or depressed.         Speech: Speech normal.        Behavior: Behavior normal. Behavior is cooperative.        Thought Content: Thought content normal.        Judgment: Judgment normal.       Results for orders placed or performed in visit on 10/30/22  COVID-19, Flu A+B and RSV  Specimen: Nasopharyngeal(NP) swabs in vial transport medium   Nasopharynge  Previous  Result Value Ref Range   SARS-CoV-2, NAA Not Detected Not Detected   Influenza A, NAA Not Detected Not Detected   Influenza B, NAA Not Detected Not Detected   RSV, NAA Not Detected Not Detected   Test Information: Comment   POC COVID-19 BinaxNow  Result Value Ref Range   SARS Coronavirus 2 Ag Negative Negative    Assessment and Plan  Breast pain, right -     [place] LIMITED ULTRASOUND INCLUDING AXILLA RIGHT BREAST; Future -     [place] LIMITED ULTRASOUND INCLUDING AXILLA LEFT BREAST ; Future -     MM 3D DIAGNOSTIC MAMMOGRAM BILATERAL BREAST; Future  Rash -     [place] LIMITED ULTRASOUND INCLUDING AXILLA RIGHT BREAST; Future -     [place] LIMITED ULTRASOUND INCLUDING AXILLA LEFT BREAST ; Future -     MM 3D DIAGNOSTIC MAMMOGRAM BILATERAL BREAST; Future  Other orders -     Triamcinolone Acetonide; Apply 1 Application topically 2 (two) times daily.  Dispense: 30 g; Refill: 0    No follow-ups on file.   Eliezer Lofts, MD

## 2023-01-08 NOTE — Assessment & Plan Note (Signed)
Rash is most consistent with contact dermatitis versus allergic dermatitis.  She will stop antibacterial soap.  Will treat nipple rash on right breast with topical triamcinolone 0.1 mg twice daily.

## 2023-01-22 ENCOUNTER — Other Ambulatory Visit: Payer: BC Managed Care – PPO

## 2023-01-22 ENCOUNTER — Inpatient Hospital Stay: Admission: RE | Admit: 2023-01-22 | Payer: BC Managed Care – PPO | Source: Ambulatory Visit

## 2023-01-26 DIAGNOSIS — Z419 Encounter for procedure for purposes other than remedying health state, unspecified: Secondary | ICD-10-CM | POA: Diagnosis not present

## 2023-02-03 ENCOUNTER — Ambulatory Visit
Admission: RE | Admit: 2023-02-03 | Discharge: 2023-02-03 | Disposition: A | Discharge status: Home / Self Care | Payer: BC Managed Care – PPO | Source: Ambulatory Visit | Attending: Family Medicine | Admitting: Family Medicine

## 2023-02-03 DIAGNOSIS — N644 Mastodynia: Secondary | ICD-10-CM | POA: Diagnosis present

## 2023-02-03 DIAGNOSIS — R21 Rash and other nonspecific skin eruption: Secondary | ICD-10-CM | POA: Diagnosis present

## 2023-02-25 DIAGNOSIS — Z419 Encounter for procedure for purposes other than remedying health state, unspecified: Secondary | ICD-10-CM | POA: Diagnosis not present

## 2023-03-28 DIAGNOSIS — Z419 Encounter for procedure for purposes other than remedying health state, unspecified: Secondary | ICD-10-CM | POA: Diagnosis not present

## 2023-04-22 ENCOUNTER — Other Ambulatory Visit: Payer: Self-pay | Admitting: Primary Care

## 2023-04-22 DIAGNOSIS — I10 Essential (primary) hypertension: Secondary | ICD-10-CM

## 2023-04-23 ENCOUNTER — Other Ambulatory Visit: Payer: Self-pay | Admitting: Family Medicine

## 2023-04-23 DIAGNOSIS — R928 Other abnormal and inconclusive findings on diagnostic imaging of breast: Secondary | ICD-10-CM

## 2023-04-23 DIAGNOSIS — N63 Unspecified lump in unspecified breast: Secondary | ICD-10-CM

## 2023-04-23 NOTE — Telephone Encounter (Signed)
Lvm for patient tcb and shedule

## 2023-04-23 NOTE — Telephone Encounter (Signed)
Patient is due for CPE/follow up in mid August, this will be required prior to any further refills.  Please schedule, thank you!   

## 2023-04-27 DIAGNOSIS — Z419 Encounter for procedure for purposes other than remedying health state, unspecified: Secondary | ICD-10-CM | POA: Diagnosis not present

## 2023-05-13 ENCOUNTER — Other Ambulatory Visit: Payer: Self-pay | Admitting: Primary Care

## 2023-05-13 DIAGNOSIS — O1003 Pre-existing essential hypertension complicating the puerperium: Secondary | ICD-10-CM

## 2023-05-27 ENCOUNTER — Encounter (INDEPENDENT_AMBULATORY_CARE_PROVIDER_SITE_OTHER): Payer: Self-pay

## 2023-05-28 DIAGNOSIS — Z419 Encounter for procedure for purposes other than remedying health state, unspecified: Secondary | ICD-10-CM | POA: Diagnosis not present

## 2023-06-10 ENCOUNTER — Ambulatory Visit (INDEPENDENT_AMBULATORY_CARE_PROVIDER_SITE_OTHER): Payer: BC Managed Care – PPO | Admitting: Primary Care

## 2023-06-10 ENCOUNTER — Other Ambulatory Visit (HOSPITAL_COMMUNITY)
Admission: RE | Admit: 2023-06-10 | Discharge: 2023-06-10 | Disposition: A | Discharge status: Home / Self Care | Payer: BC Managed Care – PPO | Source: Ambulatory Visit | Attending: Primary Care | Admitting: Primary Care

## 2023-06-10 ENCOUNTER — Encounter: Payer: Self-pay | Admitting: Primary Care

## 2023-06-10 VITALS — BP 136/78 | HR 80 | Temp 97.4°F | Ht 63.0 in | Wt 212.4 lb

## 2023-06-10 DIAGNOSIS — Z124 Encounter for screening for malignant neoplasm of cervix: Secondary | ICD-10-CM | POA: Diagnosis not present

## 2023-06-10 DIAGNOSIS — Z0001 Encounter for general adult medical examination with abnormal findings: Secondary | ICD-10-CM

## 2023-06-10 DIAGNOSIS — I10 Essential (primary) hypertension: Secondary | ICD-10-CM

## 2023-06-10 DIAGNOSIS — R21 Rash and other nonspecific skin eruption: Secondary | ICD-10-CM | POA: Diagnosis not present

## 2023-06-10 DIAGNOSIS — O1003 Pre-existing essential hypertension complicating the puerperium: Secondary | ICD-10-CM

## 2023-06-10 DIAGNOSIS — Z0000 Encounter for general adult medical examination without abnormal findings: Secondary | ICD-10-CM

## 2023-06-10 LAB — CBC
HCT: 36.9 % (ref 36.0–46.0)
Hemoglobin: 12.1 g/dL (ref 12.0–15.0)
MCHC: 32.7 g/dL (ref 30.0–36.0)
MCV: 77 fl — ABNORMAL LOW (ref 78.0–100.0)
Platelets: 332 10*3/uL (ref 150.0–400.0)
RBC: 4.79 Mil/uL (ref 3.87–5.11)
RDW: 14.1 % (ref 11.5–15.5)
WBC: 5.8 10*3/uL (ref 4.0–10.5)

## 2023-06-10 LAB — COMPREHENSIVE METABOLIC PANEL
ALT: 13 U/L (ref 0–35)
AST: 13 U/L (ref 0–37)
Albumin: 4.4 g/dL (ref 3.5–5.2)
Alkaline Phosphatase: 57 U/L (ref 39–117)
BUN: 13 mg/dL (ref 6–23)
CO2: 28 mEq/L (ref 19–32)
Calcium: 9.3 mg/dL (ref 8.4–10.5)
Chloride: 101 mEq/L (ref 96–112)
Creatinine, Ser: 0.75 mg/dL (ref 0.40–1.20)
GFR: 101.12 mL/min (ref >60.00)
Glucose, Bld: 99 mg/dL (ref 70–99)
Potassium: 3.8 mEq/L (ref 3.5–5.1)
Sodium: 137 mEq/L (ref 135–145)
Total Bilirubin: 0.4 mg/dL (ref 0.2–1.2)
Total Protein: 7 g/dL (ref 6.0–8.3)

## 2023-06-10 LAB — LIPID PANEL
Cholesterol: 189 mg/dL (ref 0–200)
HDL: 54.3 mg/dL (ref >39.00)
LDL Cholesterol: 112 mg/dL — ABNORMAL HIGH (ref 0–99)
NonHDL: 134.84
Total CHOL/HDL Ratio: 3
Triglycerides: 115 mg/dL (ref 0.0–149.0)
VLDL: 23 mg/dL (ref 0.0–40.0)

## 2023-06-10 MED ORDER — TRIAMCINOLONE ACETONIDE 0.1 % EX CREA
1.0000 | TOPICAL_CREAM | Freq: Two times a day (BID) | CUTANEOUS | 0 refills | Status: DC
Start: 2023-06-10 — End: 2024-05-20

## 2023-06-10 NOTE — Progress Notes (Signed)
Subjective:    Patient ID: Brandy Mack, female    DOB: 04-17-1986, 37 y.o.   MRN: [id number]  HPI  Brandy Mack is a very pleasant 37 y.o. female who presents today for complete physical and follow up of chronic conditions.  Immunizations: -Tetanus: Completed in 2021  Diet: Fair diet.  Exercise: Walking and tracking steps  Eye exam: Completed 3 years ago Dental exam: Completed 2 years ago  Pap Smear: Completed in May 2021  BP Readings from Last 3 Encounters:  06/10/23 136/78  01/08/23 (!) 156/90  10/30/22 116/78        Review of Systems  Constitutional:  Negative for unexpected weight change.  HENT:  Negative for rhinorrhea.   Respiratory:  Negative for cough and shortness of breath.   Cardiovascular:  Negative for chest pain.  Gastrointestinal:  Negative for constipation and diarrhea.  Genitourinary:  Negative for difficulty urinating and menstrual problem.  Musculoskeletal:  Negative for arthralgias and myalgias.  Skin:  Positive for rash.  Allergic/Immunologic: Negative for environmental allergies.  Neurological:  Negative for dizziness and headaches.  Psychiatric/Behavioral:  The patient is not nervous/anxious.          Past Medical History:  Diagnosis Date   Chronic hypertension with superimposed severe preeclampsia 07/24/2020   [x]  Aspirin 81 mg daily after 12 weeks Current antihypertensives:  Labetalol   Baseline and surveillance labs (pulled in from [facility], refresh links as needed)  Lab Results Component Value Date  PLT 210 07/10/2020  CREATININE 0.57 07/10/2020  AST 15 07/10/2020  ALT 19 07/10/2020   Antenatal Testing CHTN - O10.919  Group I   BP < 140/90, no meds, no preeclampsia, AGA, nml AFV   Group II  BP > 140/90,    COVID-19 07/17/2022   Depression    denies   Diet controlled gestational diabetes mellitus in third trimester 07/11/2020   Negative postpartum 2 hr GTT   Elevated blood pressure    Elevated blood pressure reading  05/16/2016   Gestational diabetes    Hypertension    Knee pain, acute 05/16/2016   Positive urine pregnancy test 02/07/2020   Severe preeclampsia, third trimester 08/30/2020    Social History   Socioeconomic History   Marital status: Single    Spouse name: Not on file   Number of children: Not on file   Years of education: Not on file   Highest education level: Not on file  Occupational History   Not on file  Tobacco Use   Smoking status: Former    Current packs/day: 0.00    Average packs/day: 0.3 packs/day for 10.0 years (2.5 ttl pk-yrs)    Types: Cigarettes    Start date: 01/25/2010    Quit date: 01/26/2020    Years since quitting: 3.3   Smokeless tobacco: Never  Vaping Use   Vaping status: Never Used  Substance and Sexual Activity   Alcohol use: Not Currently    Alcohol/week: 0.0 standard drinks of alcohol    Comment: social   Drug use: No   Sexual activity: Yes    Birth control/protection: I.U.D.  Other Topics Concern   Not on file  Social History Narrative   Single.   2 children.   Works at [employer].   Enjoys swimming, reading, spending time with her family.   Social Determinants of Health   Financial Resource Strain: Not on file  Food Insecurity: Not on file  Transportation Needs: Not on file  Physical Activity: Not on  file  Stress: Not on file  Social Connections: Not on file  Intimate Partner Violence: Not on file    Past Surgical History:  Procedure Laterality Date   CESAREAN SECTION N/A 08/31/2020   Procedure: CESAREAN SECTION;  Surgeon: Warden Fillers, MD;  Location: [facility] LD ORS;  Service: Obstetrics;  Laterality: N/A;   IUD INSERTION  12/17/2020       NO PAST SURGERIES      Family History  Adopted: Yes  Problem Relation Age of Onset   Hypertension Father    Stroke Father    Hyperlipidemia Mother     Allergies  Allergen Reactions   Adalat [Nifedipine] Hives   Naproxen Itching and Swelling    Throat swells   Other Hives and Swelling     Melons cause hives and throat swelling    Current Outpatient Medications on File Prior to Visit  Medication Sig Dispense Refill   hydrochlorothiazide (HYDRODIURIL) 25 MG tablet TAKE 1 TABLET BY MOUTH DAILY FOR BLOOD PRESSURE 90 tablet 3   olmesartan (BENICAR) 20 MG tablet TAKE 1 TABLET BY MOUTH DAILY FOR BLOOD PRESSURE 90 tablet 3   PARAGARD INTRAUTERINE COPPER IU by Intrauterine route.     No current facility-administered medications on file prior to visit.    BP 136/78   Pulse 80   Temp (!) 97.4 F (36.3 C) (Temporal)   Ht 5\' 3"  (1.6 m)   Wt 212 lb 6.4 oz (96.3 kg)   SpO2 99%   BMI 37.62 kg/m  Objective:   Physical Exam Exam conducted with a chaperone present.  HENT:     Right Ear: Tympanic membrane and ear canal normal.     Left Ear: Tympanic membrane and ear canal normal.     Nose: Nose normal.  Eyes:     Conjunctiva/sclera: Conjunctivae normal.     Pupils: Pupils are equal, round, and reactive to light.  Neck:     Thyroid: No thyromegaly.  Cardiovascular:     Rate and Rhythm: Normal rate and regular rhythm.     Heart sounds: No murmur heard. Pulmonary:     Effort: Pulmonary effort is normal.     Breath sounds: Normal breath sounds. No rales.  Abdominal:     General: Bowel sounds are normal.     Palpations: Abdomen is soft.     Tenderness: There is no abdominal tenderness.  Genitourinary:    Labia:        Right: No tenderness or lesion.        Left: No tenderness or lesion.      Vagina: No vaginal discharge or erythema.     Cervix: No cervical motion tenderness or discharge.     Uterus: Normal.      Adnexa: Right adnexa normal and left adnexa normal.     Comments: IUD string visible Musculoskeletal:        General: Normal range of motion.     Cervical back: Neck supple.  Lymphadenopathy:     Cervical: No cervical adenopathy.  Skin:    General: Skin is warm and dry.     Findings: No rash.  Neurological:     Mental Status: She is alert and oriented  to person, place, and time.     Cranial Nerves: No cranial nerve deficit.     Deep Tendon Reflexes: Reflexes are normal and symmetric.  Psychiatric:        Mood and Affect: Mood normal.  Assessment & Plan:  Preventative health care Assessment & Plan: Immunizations UTD. Pap smear due, completed today.  Discussed the importance of a healthy diet and regular exercise in order for weight loss, and to reduce the risk of further co-morbidity.  Exam stable. Labs pending.  Follow up in 1 year for repeat physical.    Benign essential hypertension, postpartum Assessment & Plan: Improved.  Continue hydrochlorothiazide 25 mg daily and olmesartan 20 mg daily. CMP pending.  Orders: -     Lipid panel -     Comprehensive metabolic panel -     CBC  Rash Assessment & Plan: Unclear cause. Reviewed mammogram and breast US from April 2024.  Continue triamcinolone 0.1% crema PRN. Discussed to schedule repeat breast US, orders placed.   Orders: -     Triamcinolone Acetonide; Apply 1 Application topically 2 (two) times daily.  Dispense: 30 g; Refill: 0  Screening for cervical cancer -     Cytology - PAP        Doreene Nest, NP

## 2023-06-10 NOTE — Assessment & Plan Note (Signed)
Immunizations UTD. Pap smear due, completed today.  Discussed the importance of a healthy diet and regular exercise in order for weight loss, and to reduce the risk of further co-morbidity.  Exam stable. Labs pending.  Follow up in 1 year for repeat physical.  

## 2023-06-10 NOTE — Assessment & Plan Note (Signed)
Unclear cause. Reviewed mammogram and breast US from April 2024.  Continue triamcinolone 0.1% crema PRN. Discussed to schedule repeat breast US, orders placed.

## 2023-06-10 NOTE — Assessment & Plan Note (Signed)
Improved.  Continue hydrochlorothiazide 25 mg daily and olmesartan 20 mg daily. CMP pending.

## 2023-06-10 NOTE — Patient Instructions (Signed)
Stop by the lab prior to leaving today. I will notify you of your results once received.   It was a pleasure to see you today!  

## 2023-06-12 LAB — CYTOLOGY - PAP
Comment: NEGATIVE
Diagnosis: NEGATIVE
High risk HPV: NEGATIVE

## 2023-06-16 DIAGNOSIS — E785 Hyperlipidemia, unspecified: Secondary | ICD-10-CM

## 2023-06-28 DIAGNOSIS — Z419 Encounter for procedure for purposes other than remedying health state, unspecified: Secondary | ICD-10-CM | POA: Diagnosis not present

## 2023-07-28 DIAGNOSIS — Z419 Encounter for procedure for purposes other than remedying health state, unspecified: Secondary | ICD-10-CM | POA: Diagnosis not present

## 2023-08-18 ENCOUNTER — Encounter: Payer: Self-pay | Admitting: Family Medicine

## 2023-08-28 DIAGNOSIS — Z419 Encounter for procedure for purposes other than remedying health state, unspecified: Secondary | ICD-10-CM | POA: Diagnosis not present

## 2023-09-27 DIAGNOSIS — Z419 Encounter for procedure for purposes other than remedying health state, unspecified: Secondary | ICD-10-CM | POA: Diagnosis not present

## 2023-10-28 DIAGNOSIS — Z419 Encounter for procedure for purposes other than remedying health state, unspecified: Secondary | ICD-10-CM | POA: Diagnosis not present

## 2023-11-28 DIAGNOSIS — Z419 Encounter for procedure for purposes other than remedying health state, unspecified: Secondary | ICD-10-CM | POA: Diagnosis not present

## 2023-12-26 DIAGNOSIS — Z419 Encounter for procedure for purposes other than remedying health state, unspecified: Secondary | ICD-10-CM | POA: Diagnosis not present

## 2024-02-06 DIAGNOSIS — Z419 Encounter for procedure for purposes other than remedying health state, unspecified: Secondary | ICD-10-CM | POA: Diagnosis not present

## 2024-02-26 ENCOUNTER — Ambulatory Visit (INDEPENDENT_AMBULATORY_CARE_PROVIDER_SITE_OTHER): Admitting: Primary Care

## 2024-02-26 ENCOUNTER — Encounter: Payer: Self-pay | Admitting: Primary Care

## 2024-02-26 VITALS — BP 122/76 | HR 70 | Temp 97.8°F | Ht 63.0 in | Wt 219.0 lb

## 2024-02-26 DIAGNOSIS — L299 Pruritus, unspecified: Secondary | ICD-10-CM | POA: Diagnosis not present

## 2024-02-26 LAB — CBC
HCT: 34.9 % — ABNORMAL LOW (ref 36.0–46.0)
Hemoglobin: 11.5 g/dL — ABNORMAL LOW (ref 12.0–15.0)
MCHC: 32.8 g/dL (ref 30.0–36.0)
MCV: 74.5 fl — ABNORMAL LOW (ref 78.0–100.0)
Platelets: 264 10*3/uL (ref 150.0–400.0)
RBC: 4.68 Mil/uL (ref 3.87–5.11)
RDW: 15.5 % (ref 11.5–15.5)
WBC: 4.8 10*3/uL (ref 4.0–10.5)

## 2024-02-26 LAB — SEDIMENTATION RATE: Sed Rate: 13 mm/h (ref 0–20)

## 2024-02-26 LAB — TSH: TSH: 1.34 u[IU]/mL (ref 0.35–5.50)

## 2024-02-26 NOTE — Assessment & Plan Note (Signed)
 Unclear etiology.  Checking labs today including alpha gal, ANA, TSH, CBC, food allergy panel, sed rate. Referral placed to allergist for skin prick testing and further evaluation.  Recommended she start a daily antihistamine for now.

## 2024-02-26 NOTE — Progress Notes (Signed)
 Subjective:    Patient ID: Brandy Mack, female    DOB: September 28, 1986, 38 y.o.   MRN: [id number]  Allergic Reaction Associated symptoms include a rash. Pertinent negatives include no trouble swallowing or wheezing.    Brandy Mack is a very pleasant 38 y.o. female with a history of tobacco use disorder, attention who presents today to discuss allergic reaction.   Over the last 1 month she's noticed symptoms of an allergic reaction when coming in contact with cold things/foods. Examples include lip swelling and itching when eating ice cream, skin irritation with rash and itching when holding cold foods from the refrigerator, itching to the palms of her hands when cutting cold potatoes, itching when washing her hands with cold water, itching to her face with cold wind from being outdoors. She does notice her fingertips turn cold and colors.  She denies chest tightness, wheezing, throat tightness, symptoms with warm objects, symptoms with foods from mammals. She does not take an antihistamine on a daily basis.    Review of Systems  HENT:  Negative for trouble swallowing.   Respiratory:  Negative for shortness of breath and wheezing.   Skin:  Positive for rash.       Itching, see HPI         Past Medical History:  Diagnosis Date   Chronic hypertension with superimposed severe preeclampsia 07/24/2020   [x]  Aspirin  81 mg daily after 12 weeks Current antihypertensives:  Labetalol    Baseline and surveillance labs (pulled in from [facility], refresh links as needed)  Lab Results Component Value Date  PLT 210 07/10/2020  CREATININE 0.57 07/10/2020  AST 15 07/10/2020  ALT 19 07/10/2020   Antenatal Testing CHTN - O10.919  Group I   BP < 140/90, no meds, no preeclampsia, AGA, nml AFV   Group II  BP > 140/90,    COVID-19 07/17/2022   Depression    denies   Diet controlled gestational diabetes mellitus in third trimester 07/11/2020   Negative postpartum 2 hr GTT   Elevated blood  pressure    Elevated blood pressure reading 05/16/2016   Gestational diabetes    Hypertension    Knee pain, acute 05/16/2016   Positive urine pregnancy test 02/07/2020   Severe preeclampsia, third trimester 08/30/2020    Social History   Socioeconomic History   Marital status: Single    Spouse name: Not on file   Number of children: Not on file   Years of education: Not on file   Highest education level: Not on file  Occupational History   Not on file  Tobacco Use   Smoking status: Former    Current packs/day: 0.00    Average packs/day: 0.3 packs/day for 10.0 years (2.5 ttl pk-yrs)    Types: Cigarettes    Start date: 01/25/2010    Quit date: 01/26/2020    Years since quitting: 4.0   Smokeless tobacco: Never  Vaping Use   Vaping status: Never Used  Substance and Sexual Activity   Alcohol use: Not Currently    Alcohol/week: 0.0 standard drinks of alcohol    Comment: social   Drug use: No   Sexual activity: Yes    Birth control/protection: I.U.D.  Other Topics Concern   Not on file  Social History Narrative   Single.   2 children.   Works at [employer].   Enjoys swimming, reading, spending time with her family.   Social Drivers of Corporate investment banker Strain: Not on file  Food Insecurity: Not on file  Transportation Needs: Not on file  Physical Activity: Not on file  Stress: Not on file  Social Connections: Not on file  Intimate Partner Violence: Not on file    Past Surgical History:  Procedure Laterality Date   CESAREAN SECTION N/A 08/31/2020   Procedure: CESAREAN SECTION;  Surgeon: Abigail Abler, MD;  Location: [facility] LD [facility]S;  Service: Obstetrics;  Laterality: N/A;   IUD INSERTION  12/17/2020       NO PAST SURGERIES      Family History  Adopted: Yes  Problem Relation Age of Onset   Hypertension Father    Stroke Father    Hyperlipidemia Mother     Allergies  Allergen Reactions   Adalat  [Nifedipine ] Hives   Naproxen Itching and Swelling     Throat swells   Other Hives and Swelling    Melons cause hives and throat swelling    Current Outpatient Medications on File Prior to Visit  Medication Sig Dispense Refill   hydrochlorothiazide  (HYDRODIURIL ) 25 MG tablet TAKE 1 TABLET BY MOUTH DAILY FOR BLOOD PRESSURE 90 tablet 3   olmesartan  (BENICAR ) 20 MG tablet TAKE 1 TABLET BY MOUTH DAILY FOR BLOOD PRESSURE 90 tablet 3   PARAGARD  INTRAUTERINE COPPER  IU by Intrauterine route.     triamcinolone  cream (KENALOG ) 0.1 % Apply 1 Application topically 2 (two) times daily. 30 g 0   No current facility-administered medications on file prior to visit.    BP 122/76   Pulse 70   Temp 97.8 F (36.6 C) (Temporal)   Ht 5\' 3"  (1.6 m)   Wt 219 lb (99.3 kg)   LMP 02/26/2024   SpO2 98%   BMI 38.79 kg/m  Objective:   Physical Exam Cardiovascular:     Rate and Rhythm: Normal rate and regular rhythm.  Pulmonary:     Effort: Pulmonary effort is normal.     Breath sounds: Normal breath sounds.  Musculoskeletal:     Cervical back: Neck supple.  Skin:    General: Skin is warm and dry.     Findings: No rash.  Neurological:     Mental Status: She is alert and oriented to person, place, and time.  Psychiatric:        Mood and Affect: Mood normal.           Assessment & Plan:  Pruritus Assessment & Plan: Unclear etiology.  Checking labs today including alpha gal, ANA, TSH, CBC, food allergy panel, sed rate. Referral placed to allergist for skin prick testing and further evaluation.  Recommended she start a daily antihistamine for now.  Orders: -     ANA -     CBC -     TSH -     Sedimentation rate -     Food Allergy Profile -     Alpha-Gal Panel -     Ambulatory referral to Allergy        Kennette Cuthrell K Sheray Grist, NP

## 2024-02-26 NOTE — Patient Instructions (Signed)
 Stop by the lab prior to leaving today. I will notify you of your results once received.   You will either be contacted via phone regarding your referral to the allergist, or you may receive a letter on your MyChart portal from our referral team with instructions for scheduling an appointment. Please let us  know if you have not been contacted by anyone within two weeks.  It was a pleasure to see you today!

## 2024-02-28 DIAGNOSIS — O1003 Pre-existing essential hypertension complicating the puerperium: Secondary | ICD-10-CM

## 2024-02-28 DIAGNOSIS — I10 Essential (primary) hypertension: Secondary | ICD-10-CM

## 2024-02-29 MED ORDER — HYDROCHLOROTHIAZIDE 25 MG PO TABS
25.0000 mg | ORAL_TABLET | Freq: Every day | ORAL | 0 refills | Status: DC
Start: 2024-02-29 — End: 2024-05-24

## 2024-02-29 MED ORDER — OLMESARTAN MEDOXOMIL 20 MG PO TABS
ORAL_TABLET | ORAL | 0 refills | Status: DC
Start: 2024-02-29 — End: 2024-05-24

## 2024-03-02 LAB — FOOD ALLERGY PROFILE
Allergen, Salmon, f41: <0.1 kU/L
Almonds: <0.1 kU/L
Brazil Nut: <0.1 kU/L
CLASS: 0
CLASS: 0
CLASS: 0
CLASS: 0
CLASS: 0
CLASS: 0
CLASS: 0
CLASS: 0
CLASS: 0
CLASS: 0
CLASS: 0
CLASS: 0
Cashew IgE: <0.1 kU/L
Class: 0
Class: 0
Class: 0
Class: 0
Egg White IgE: <0.1 kU/L
Fish Cod: <0.1 kU/L
Hazelnut: <0.1 kU/L
Macadamia Nut: <0.1 kU/L
Milk IgE: 0.21 kU/L — ABNORMAL HIGH
Peanut IgE: <0.1 kU/L
Scallop IgE: <0.1 kU/L
Sesame Seed f10: <0.1 kU/L
Shrimp IgE: <0.1 kU/L
Soybean IgE: <0.1 kU/L
Tuna IgE: <0.1 kU/L
Walnut: <0.1 kU/L
Wheat IgE: <0.1 kU/L

## 2024-03-02 LAB — ALPHA-GAL PANEL
Allergen, Pork, f26: <0.1 kU/L
Allergen, Pork, f26: <0.1 kU/L
Beef: <0.1 kU/L
CLASS: 0
CLASS: 0
Class: 0
GALACTOSE-ALPHA-1,3-GALACTOSE IGE*: 0.18 kU/L — ABNORMAL HIGH (ref <0.10)

## 2024-03-02 LAB — MILK COMPONENT PANEL RFLX
Allergen, Alpha-lactalb,f76: <0.1 kU/L
Allergen, Beta-lactoglob,f77: 0.3 kU/L — ABNORMAL HIGH
Allergen, Casein, f78: <0.1 kU/L
CLASS: 0
CLASS: 0

## 2024-03-02 LAB — ANA: Anti Nuclear Antibody (ANA): NEGATIVE

## 2024-03-02 LAB — INTERPRETATION:

## 2024-03-07 DIAGNOSIS — Z419 Encounter for procedure for purposes other than remedying health state, unspecified: Secondary | ICD-10-CM | POA: Diagnosis not present

## 2024-04-07 DIAGNOSIS — Z419 Encounter for procedure for purposes other than remedying health state, unspecified: Secondary | ICD-10-CM | POA: Diagnosis not present

## 2024-04-11 DIAGNOSIS — D649 Anemia, unspecified: Secondary | ICD-10-CM

## 2024-05-07 DIAGNOSIS — Z419 Encounter for procedure for purposes other than remedying health state, unspecified: Secondary | ICD-10-CM | POA: Diagnosis not present

## 2024-05-20 ENCOUNTER — Ambulatory Visit: Payer: Self-pay | Admitting: Primary Care

## 2024-05-20 ENCOUNTER — Encounter: Payer: Self-pay | Admitting: Primary Care

## 2024-05-20 ENCOUNTER — Ambulatory Visit (INDEPENDENT_AMBULATORY_CARE_PROVIDER_SITE_OTHER): Admitting: Primary Care

## 2024-05-20 VITALS — BP 132/84 | HR 80 | Temp 97.9°F | Ht 63.0 in | Wt 217.0 lb

## 2024-05-20 DIAGNOSIS — Z113 Encounter for screening for infections with a predominantly sexual mode of transmission: Secondary | ICD-10-CM | POA: Diagnosis not present

## 2024-05-20 DIAGNOSIS — I10 Essential (primary) hypertension: Secondary | ICD-10-CM

## 2024-05-20 DIAGNOSIS — D649 Anemia, unspecified: Secondary | ICD-10-CM | POA: Diagnosis not present

## 2024-05-20 DIAGNOSIS — O1003 Pre-existing essential hypertension complicating the puerperium: Secondary | ICD-10-CM

## 2024-05-20 DIAGNOSIS — R21 Rash and other nonspecific skin eruption: Secondary | ICD-10-CM | POA: Diagnosis not present

## 2024-05-20 LAB — CBC
HCT: 38.2 % (ref 36.0–46.0)
Hemoglobin: 12.7 g/dL (ref 12.0–15.0)
MCHC: 33.3 g/dL (ref 30.0–36.0)
MCV: 78.9 fl (ref 78.0–100.0)
Platelets: 245 K/uL (ref 150.0–400.0)
RBC: 4.85 Mil/uL (ref 3.87–5.11)
RDW: 15.1 % (ref 11.5–15.5)
WBC: 4.8 K/uL (ref 4.0–10.5)

## 2024-05-20 LAB — FERRITIN: Ferritin: 9.7 ng/mL — ABNORMAL LOW (ref 10.0–291.0)

## 2024-05-20 MED ORDER — TRIAMCINOLONE ACETONIDE 0.1 % EX CREA
1.0000 | TOPICAL_CREAM | Freq: Two times a day (BID) | CUTANEOUS | 0 refills | Status: AC
Start: 2024-05-20 — End: ?

## 2024-05-20 NOTE — Progress Notes (Signed)
 Subjective:    Patient ID: Brandy Mack, female    DOB: 1986/06/14, 38 y.o.   MRN: [id number]  HPI  Brandy Mack is a very pleasant 38 y.o. female who presents today for STD screening. She is also needing repeat iron levels and a refill of her triamcinolone  cream.  She is not sexually active.  Would like STD testing for precautionary purposes.  She denies vaginal discharge, vaginal itching, hematuria, dysuria, pelvic pain.  She is taking ferrous sulfate 325 mg daily.  BP Readings from Last 3 Encounters:  05/20/24 132/84  02/26/24 122/76  06/10/23 136/78      Review of Systems  Genitourinary:  Negative for dysuria, frequency, hematuria, pelvic pain and vaginal discharge.         Past Medical History:  Diagnosis Date   Chronic hypertension with superimposed severe preeclampsia 07/24/2020   [x]  Aspirin  81 mg daily after 12 weeks Current antihypertensives:  Labetalol    Baseline and surveillance labs (pulled in from [facility], refresh links as needed)  Lab Results Component Value Date  PLT 210 07/10/2020  CREATININE 0.57 07/10/2020  AST 15 07/10/2020  ALT 19 07/10/2020   Antenatal Testing CHTN - O10.919  Group I   BP < 140/90, no meds, no preeclampsia, AGA, nml AFV   Group II  BP > 140/90,    COVID-19 07/17/2022   Depression    denies   Diet controlled gestational diabetes mellitus in third trimester 07/11/2020   Negative postpartum 2 hr GTT   Elevated blood pressure    Elevated blood pressure reading 05/16/2016   Gestational diabetes    Hypertension    Knee pain, acute 05/16/2016   Positive urine pregnancy test 02/07/2020   Severe preeclampsia, third trimester 08/30/2020    Social History   Socioeconomic History   Marital status: Single    Spouse name: Not on file   Number of children: Not on file   Years of education: Not on file   Highest education level: Not on file  Occupational History   Not on file  Tobacco Use   Smoking status: Former     Current packs/day: 0.00    Average packs/day: 0.3 packs/day for 10.0 years (2.5 ttl pk-yrs)    Types: Cigarettes    Start date: 01/25/2010    Quit date: 01/26/2020    Years since quitting: 4.3   Smokeless tobacco: Never  Vaping Use   Vaping status: Never Used  Substance and Sexual Activity   Alcohol use: Not Currently    Alcohol/week: 0.0 standard drinks of alcohol    Comment: social   Drug use: No   Sexual activity: Yes    Birth control/protection: I.U.D.  Other Topics Concern   Not on file  Social History Narrative   Single.   2 children.   Works at [employer].   Enjoys swimming, reading, spending time with her family.   Social Drivers of Corporate investment banker Strain: Not on file  Food Insecurity: Not on file  Transportation Needs: Not on file  Physical Activity: Not on file  Stress: Not on file  Social Connections: Not on file  Intimate Partner Violence: Not on file    Past Surgical History:  Procedure Laterality Date   CESAREAN SECTION N/A 08/31/2020   Procedure: CESAREAN SECTION;  Surgeon: Zina Jerilynn LABOR, MD;  Location: [facility] LD ORS;  Service: Obstetrics;  Laterality: N/A;   IUD INSERTION  12/17/2020       NO PAST SURGERIES  Family History  Adopted: Yes  Problem Relation Age of Onset   Hypertension Father    Stroke Father    Hyperlipidemia Mother     Allergies  Allergen Reactions   Adalat  [Nifedipine ] Hives   Naproxen Itching and Swelling    Throat swells   Other Hives and Swelling    Melons cause hives and throat swelling    Current Outpatient Medications on File Prior to Visit  Medication Sig Dispense Refill   hydrochlorothiazide  (HYDRODIURIL ) 25 MG tablet Take 1 tablet (25 mg total) by mouth daily. for blood pressure 90 tablet 0   olmesartan  (BENICAR ) 20 MG tablet TAKE 1 TABLET BY MOUTH DAILY FOR BLOOD PRESSURE 90 tablet 0   PARAGARD  INTRAUTERINE COPPER  IU by Intrauterine route.     No current facility-administered medications on file  prior to visit.    BP 132/84   Pulse 80   Temp 97.9 F (36.6 C) (Temporal)   Ht 5' 3 (1.6 m)   Wt 217 lb (98.4 kg)   LMP 05/07/2024 (Exact Date)   SpO2 98%   BMI 38.44 kg/m  Objective:   Physical Exam Cardiovascular:     Rate and Rhythm: Normal rate and regular rhythm.  Pulmonary:     Effort: Pulmonary effort is normal.     Breath sounds: Normal breath sounds.  Musculoskeletal:     Cervical back: Neck supple.  Skin:    General: Skin is warm and dry.  Neurological:     Mental Status: She is alert and oriented to person, place, and time.  Psychiatric:        Mood and Affect: Mood normal.           Assessment & Plan:  Anemia, unspecified type -     CBC -     Ferritin  Rash -     Triamcinolone  Acetonide; Apply 1 Application topically 2 (two) times daily.  Dispense: 30 g; Refill: 0  Screening for STD (sexually transmitted disease) -     HSV(herpes simplex vrs) 1+2 ab-IgG -     HIV Antibody (routine testing w rflx) -     Hepatitis C antibody -     RPR -     C. trachomatis/N. gonorrhoeae RNA -     Trichomonas vaginalis, RNA        Comer MARLA Gaskins, NP

## 2024-05-20 NOTE — Patient Instructions (Signed)
 Stop by the lab prior to leaving today. I will notify you of your results once received.   It was a pleasure to see you today!

## 2024-05-21 LAB — HSV(HERPES SIMPLEX VRS) I + II AB-IGG
HSV 1 IGG,TYPE SPECIFIC AB: 13 {index} — ABNORMAL HIGH
HSV 2 IGG,TYPE SPECIFIC AB: 4.9 {index} — ABNORMAL HIGH

## 2024-05-21 LAB — C. TRACHOMATIS/N. GONORRHOEAE RNA
C. trachomatis RNA, TMA: NOT DETECTED
N. gonorrhoeae RNA, TMA: NOT DETECTED

## 2024-05-21 LAB — TRICHOMONAS VAGINALIS, PROBE AMP: Trichomonas vaginalis RNA: NOT DETECTED

## 2024-05-21 LAB — HIV ANTIBODY (ROUTINE TESTING W REFLEX): HIV 1&2 Ab, 4th Generation: NONREACTIVE

## 2024-05-21 LAB — HEPATITIS C ANTIBODY: Hepatitis C Ab: NONREACTIVE

## 2024-05-21 LAB — RPR: RPR Ser Ql: NONREACTIVE

## 2024-05-24 MED ORDER — OLMESARTAN MEDOXOMIL 20 MG PO TABS
ORAL_TABLET | ORAL | 0 refills | Status: DC
Start: 2024-05-24 — End: 2024-07-19

## 2024-05-24 MED ORDER — HYDROCHLOROTHIAZIDE 25 MG PO TABS: 25.0000 mg | ORAL_TABLET | Freq: Every day | ORAL | 0 refills | Status: DC

## 2024-06-07 DIAGNOSIS — Z419 Encounter for procedure for purposes other than remedying health state, unspecified: Secondary | ICD-10-CM | POA: Diagnosis not present

## 2024-07-08 DIAGNOSIS — Z419 Encounter for procedure for purposes other than remedying health state, unspecified: Secondary | ICD-10-CM | POA: Diagnosis not present

## 2024-07-18 ENCOUNTER — Other Ambulatory Visit: Payer: Self-pay | Admitting: Primary Care

## 2024-07-18 DIAGNOSIS — I10 Essential (primary) hypertension: Secondary | ICD-10-CM

## 2024-07-18 DIAGNOSIS — O1003 Pre-existing essential hypertension complicating the puerperium: Secondary | ICD-10-CM

## 2024-08-03 ENCOUNTER — Ambulatory Visit: Admitting: Primary Care

## 2024-08-03 ENCOUNTER — Encounter: Payer: Self-pay | Admitting: Primary Care

## 2024-08-03 VITALS — BP 124/88 | HR 79 | Temp 97.5°F | Ht 63.0 in | Wt 222.0 lb

## 2024-08-03 DIAGNOSIS — Z23 Encounter for immunization: Secondary | ICD-10-CM

## 2024-08-03 DIAGNOSIS — O1003 Pre-existing essential hypertension complicating the puerperium: Secondary | ICD-10-CM

## 2024-08-03 DIAGNOSIS — D649 Anemia, unspecified: Secondary | ICD-10-CM

## 2024-08-03 DIAGNOSIS — Z0000 Encounter for general adult medical examination without abnormal findings: Secondary | ICD-10-CM

## 2024-08-03 LAB — LIPID PANEL
Cholesterol: 170 mg/dL (ref 0–200)
HDL: 55 mg/dL (ref >39.00)
LDL Cholesterol: 99 mg/dL (ref 0–99)
NonHDL: 115.01
Total CHOL/HDL Ratio: 3
Triglycerides: 78 mg/dL (ref 0.0–149.0)
VLDL: 15.6 mg/dL (ref 0.0–40.0)

## 2024-08-03 LAB — COMPREHENSIVE METABOLIC PANEL WITH GFR
ALT: 14 U/L (ref 0–35)
AST: 13 U/L (ref 0–37)
Albumin: 4 g/dL (ref 3.5–5.2)
Alkaline Phosphatase: 56 U/L (ref 39–117)
BUN: 14 mg/dL (ref 6–23)
CO2: 26 meq/L (ref 19–32)
Calcium: 8.6 mg/dL (ref 8.4–10.5)
Chloride: 104 meq/L (ref 96–112)
Creatinine, Ser: 0.77 mg/dL (ref 0.40–1.20)
GFR: 97.19 mL/min (ref >60.00)
Glucose, Bld: 92 mg/dL (ref 70–99)
Potassium: 4.2 meq/L (ref 3.5–5.1)
Sodium: 138 meq/L (ref 135–145)
Total Bilirubin: 0.4 mg/dL (ref 0.2–1.2)
Total Protein: 6.3 g/dL (ref 6.0–8.3)

## 2024-08-03 LAB — CBC
HCT: 37.5 % (ref 36.0–46.0)
Hemoglobin: 12.7 g/dL (ref 12.0–15.0)
MCHC: 33.8 g/dL (ref 30.0–36.0)
MCV: 80.2 fl (ref 78.0–100.0)
Platelets: 249 K/uL (ref 150.0–400.0)
RBC: 4.68 Mil/uL (ref 3.87–5.11)
RDW: 13.8 % (ref 11.5–15.5)
WBC: 5.1 K/uL (ref 4.0–10.5)

## 2024-08-03 LAB — IBC + FERRITIN
Ferritin: 10.8 ng/mL (ref 10.0–291.0)
Iron: 111 ug/dL (ref 42–145)
Saturation Ratios: 24 % (ref 20.0–50.0)
TIBC: 463.4 ug/dL — ABNORMAL HIGH (ref 250.0–450.0)
Transferrin: 331 mg/dL (ref 212.0–360.0)

## 2024-08-03 NOTE — Progress Notes (Signed)
 Subjective:    Patient ID: Brandy Mack, female    DOB: 12-Apr-1986, 38 y.o.   MRN: [id number]  Brandy Mack is a very pleasant 38 y.o. female who presents today for complete physical and follow up of chronic conditions.  Immunizations: -Tetanus: Completed in 2021 -Influenza: Influenza vaccine provided today.  -HPV: Completed 1 or 2 vaccines.   Diet: Fair diet.  Exercise: No regular exercise.  Eye exam: Completed 3 years ago  Dental exam: Completes semi-annually    Pap Smear: Completed in 2024   BP Readings from Last 3 Encounters:  08/03/24 124/88  05/20/24 132/84  02/26/24 122/76      Review of Systems  Constitutional:  Negative for unexpected weight change.  HENT:  Negative for rhinorrhea.   Respiratory:  Negative for cough and shortness of breath.   Cardiovascular:  Negative for chest pain.  Gastrointestinal:  Negative for constipation and diarrhea.  Genitourinary:  Negative for difficulty urinating and menstrual problem.  Musculoskeletal:  Negative for arthralgias and myalgias.  Skin:  Negative for rash.  Allergic/Immunologic: Negative for environmental allergies.  Neurological:  Negative for dizziness, numbness and headaches.  Psychiatric/Behavioral:  The patient is not nervous/anxious.          Past Medical History:  Diagnosis Date   Chronic hypertension with superimposed severe preeclampsia 07/24/2020   [x]  Aspirin  81 mg daily after 12 weeks Current antihypertensives:  Labetalol    Baseline and surveillance labs (pulled in from [facility], refresh links as needed)  Lab Results Component Value Date  PLT 210 07/10/2020  CREATININE 0.57 07/10/2020  AST 15 07/10/2020  ALT 19 07/10/2020   Antenatal Testing CHTN - O10.919  Group I   BP < 140/90, no meds, no preeclampsia, AGA, nml AFV   Group II  BP > 140/90,    COVID-19 07/17/2022   Depression    denies   Diet controlled gestational diabetes mellitus in third trimester 07/11/2020   Negative  postpartum 2 hr GTT   Elevated blood pressure    Elevated blood pressure reading 05/16/2016   Gestational diabetes    Hypertension    Knee pain, acute 05/16/2016   Positive urine pregnancy test 02/07/2020   Severe preeclampsia, third trimester 08/30/2020    Social History   Socioeconomic History   Marital status: Single    Spouse name: Not on file   Number of children: Not on file   Years of education: Not on file   Highest education level: Not on file  Occupational History   Not on file  Tobacco Use   Smoking status: Former    Current packs/day: 0.00    Average packs/day: 0.3 packs/day for 10.0 years (2.5 ttl pk-yrs)    Types: Cigarettes    Start date: 01/25/2010    Quit date: 01/26/2020    Years since quitting: 4.5   Smokeless tobacco: Never  Vaping Use   Vaping status: Never Used  Substance and Sexual Activity   Alcohol use: Not Currently    Alcohol/week: 0.0 standard drinks of alcohol    Comment: social   Drug use: No   Sexual activity: Yes    Birth control/protection: I.U.D.  Other Topics Concern   Not on file  Social History Narrative   Single.   2 children.   Works at [employer].   Enjoys swimming, reading, spending time with her family.   Social Drivers of Corporate investment banker Strain: Not on file  Food Insecurity: Not on file  Transportation  Needs: Not on file  Physical Activity: Not on file  Stress: Not on file  Social Connections: Not on file  Intimate Partner Violence: Not on file    Past Surgical History:  Procedure Laterality Date   CESAREAN SECTION N/A 08/31/2020   Procedure: CESAREAN SECTION;  Surgeon: Zina Jerilynn LABOR, MD;  Location: [facility] LD ORS;  Service: Obstetrics;  Laterality: N/A;   IUD INSERTION  12/17/2020       NO PAST SURGERIES      Family History  Adopted: Yes  Problem Relation Age of Onset   Hypertension Father    Stroke Father    Hyperlipidemia Mother     Allergies  Allergen Reactions   Adalat  [Nifedipine ] Hives    Naproxen Itching and Swelling    Throat swells   Other Hives and Swelling    Melons cause hives and throat swelling    Current Outpatient Medications on File Prior to Visit  Medication Sig Dispense Refill   hydrochlorothiazide  (HYDRODIURIL ) 25 MG tablet TAKE 1 TABLET BY MOUTH DAILY FOR BLOOD PRESSURE 90 tablet 0   olmesartan  (BENICAR ) 20 MG tablet TAKE 1 TABLET BY MOUTH DAILY FOR BLOOD PRESSURE 90 tablet 0   PARAGARD  INTRAUTERINE COPPER  IU by Intrauterine route.     triamcinolone  cream (KENALOG ) 0.1 % Apply 1 Application topically 2 (two) times daily. 30 g 0   No current facility-administered medications on file prior to visit.    BP 124/88   Pulse 79   Temp (!) 97.5 F (36.4 C) (Temporal)   Ht 5' 3 (1.6 m)   Wt 222 lb (100.7 kg)   LMP 07/23/2024   SpO2 99%   BMI 39.33 kg/m  Objective:   Physical Exam HENT:     Right Ear: Tympanic membrane and ear canal normal.     Left Ear: Tympanic membrane and ear canal normal.  Eyes:     Pupils: Pupils are equal, round, and reactive to light.  Cardiovascular:     Rate and Rhythm: Normal rate and regular rhythm.  Pulmonary:     Effort: Pulmonary effort is normal.     Breath sounds: Normal breath sounds.  Abdominal:     General: Bowel sounds are normal.     Palpations: Abdomen is soft.     Tenderness: There is no abdominal tenderness.  Musculoskeletal:        General: Normal range of motion.     Cervical back: Neck supple.  Skin:    General: Skin is warm and dry.  Neurological:     Mental Status: She is alert and oriented to person, place, and time.     Cranial Nerves: No cranial nerve deficit.     Deep Tendon Reflexes:     Reflex Scores:      Patellar reflexes are 2+ on the right side and 2+ on the left side. Psychiatric:        Mood and Affect: Mood normal.     Physical Exam        Assessment & Plan:  Preventative health care Assessment & Plan: Immunizations UTD. Influenza vaccine provided today. Discussed HPV  vaccines. She will look for records.   Pap smear UTD.  Discussed the importance of a healthy diet and regular exercise in order for weight loss, and to reduce the risk of further co-morbidity.  Exam stable. Labs pending.  Follow up in 1 year for repeat physical.    Benign essential hypertension, postpartum Assessment & Plan: Controlled.  Continue hydrochlorothiazide   25 mg daily, olmesartan  20 mg daily CMP pending.  Orders: -     Lipid panel -     Comprehensive metabolic panel with GFR  Anemia, unspecified type Assessment & Plan: Repeat iron studies and CBC pending.  Continue iron supplement daily.  Orders: -     IBC + Ferritin -     CBC    Assessment and Plan Assessment & Plan         Comer MARLA Gaskins, NP   History of Present Illness

## 2024-08-03 NOTE — Assessment & Plan Note (Signed)
 Repeat iron studies and CBC pending.  Continue iron supplement daily.

## 2024-08-03 NOTE — Patient Instructions (Signed)
 Stop by the lab prior to leaving today. I will notify you of your results once received.   It was a pleasure to see you today!

## 2024-08-03 NOTE — Assessment & Plan Note (Signed)
 Controlled.  Continue hydrochlorothiazide  25 mg daily, olmesartan  20 mg daily CMP pending.

## 2024-08-03 NOTE — Assessment & Plan Note (Addendum)
 Immunizations UTD. Influenza vaccine provided today. Discussed HPV vaccines. She will look for records.   Pap smear UTD.  Discussed the importance of a healthy diet and regular exercise in order for weight loss, and to reduce the risk of further co-morbidity.  Exam stable. Labs pending.  Follow up in 1 year for repeat physical.

## 2024-08-04 ENCOUNTER — Ambulatory Visit: Payer: Self-pay | Admitting: Primary Care

## 2024-09-07 DIAGNOSIS — Z419 Encounter for procedure for purposes other than remedying health state, unspecified: Secondary | ICD-10-CM | POA: Diagnosis not present

## 2024-10-07 DIAGNOSIS — Z419 Encounter for procedure for purposes other than remedying health state, unspecified: Secondary | ICD-10-CM | POA: Diagnosis not present

## 2024-10-11 ENCOUNTER — Other Ambulatory Visit: Payer: Self-pay | Admitting: Primary Care

## 2024-10-11 DIAGNOSIS — O1003 Pre-existing essential hypertension complicating the puerperium: Secondary | ICD-10-CM

## 2024-10-11 DIAGNOSIS — I10 Essential (primary) hypertension: Secondary | ICD-10-CM
